# Patient Record
Sex: Female | Born: 1975 | Race: Black or African American | Hispanic: No | Marital: Single | Smoking: Former smoker
Health system: Southern US, Community
[De-identification: ages and names within clinical notes are randomized; demographics above are authoritative.]

## PROBLEM LIST (undated history)

## (undated) HISTORY — PX: FOOT SURGERY: SHX648

## (undated) HISTORY — PX: TUBAL LIGATION: SHX77

## (undated) HISTORY — PX: CARPAL TUNNEL RELEASE: SHX101

---

## 2004-05-21 ENCOUNTER — Emergency Department (HOSPITAL_COMMUNITY): Admission: EM | Admit: 2004-05-21 | Discharge: 2004-05-22 | Payer: Self-pay | Admitting: Emergency Medicine

## 2005-07-28 ENCOUNTER — Emergency Department (HOSPITAL_COMMUNITY): Admission: EM | Admit: 2005-07-28 | Discharge: 2005-07-28 | Payer: Self-pay | Admitting: Emergency Medicine

## 2011-04-28 ENCOUNTER — Ambulatory Visit (INDEPENDENT_AMBULATORY_CARE_PROVIDER_SITE_OTHER): Payer: PRIVATE HEALTH INSURANCE

## 2011-04-28 ENCOUNTER — Inpatient Hospital Stay (INDEPENDENT_AMBULATORY_CARE_PROVIDER_SITE_OTHER)
Admission: RE | Admit: 2011-04-28 | Discharge: 2011-04-28 | Disposition: A | Discharge status: Home / Self Care | Payer: PRIVATE HEALTH INSURANCE | Source: Ambulatory Visit | Attending: Family Medicine | Admitting: Family Medicine

## 2011-04-28 DIAGNOSIS — S93529A Sprain of metatarsophalangeal joint of unspecified toe(s), initial encounter: Secondary | ICD-10-CM

## 2011-06-19 ENCOUNTER — Inpatient Hospital Stay (INDEPENDENT_AMBULATORY_CARE_PROVIDER_SITE_OTHER)
Admission: RE | Admit: 2011-06-19 | Discharge: 2011-06-19 | Disposition: A | Discharge status: Home / Self Care | Payer: Medicaid - Out of State | Source: Ambulatory Visit | Attending: Emergency Medicine | Admitting: Emergency Medicine

## 2011-06-19 DIAGNOSIS — N390 Urinary tract infection, site not specified: Secondary | ICD-10-CM

## 2011-06-19 LAB — POCT URINALYSIS DIP (DEVICE)
Bilirubin Urine: NEGATIVE
Glucose, UA: NEGATIVE mg/dL
Hgb urine dipstick: NEGATIVE
Ketones, ur: 15 mg/dL — AB
Leukocytes, UA: NEGATIVE
Nitrite: NEGATIVE
Protein, ur: NEGATIVE mg/dL
Specific Gravity, Urine: 1.015 (ref 1.005–1.030)
Urobilinogen, UA: 0.2 mg/dL (ref 0.0–1.0)
pH: 5 (ref 5.0–8.0)

## 2011-06-19 LAB — POCT PREGNANCY, URINE: Preg Test, Ur: NEGATIVE

## 2011-06-20 LAB — URINE CULTURE
Colony Count: NO GROWTH
Culture  Setup Time: 201209170031
Culture: NO GROWTH

## 2011-06-21 ENCOUNTER — Emergency Department (HOSPITAL_COMMUNITY): Payer: Medicaid - Out of State

## 2011-06-21 ENCOUNTER — Emergency Department (HOSPITAL_COMMUNITY)
Admission: EM | Admit: 2011-06-21 | Discharge: 2011-06-21 | Disposition: A | Discharge status: Home / Self Care | Payer: Medicaid - Out of State | Attending: Emergency Medicine | Admitting: Emergency Medicine

## 2011-06-21 DIAGNOSIS — R630 Anorexia: Secondary | ICD-10-CM | POA: Insufficient documentation

## 2011-06-21 DIAGNOSIS — R110 Nausea: Secondary | ICD-10-CM | POA: Insufficient documentation

## 2011-06-21 DIAGNOSIS — R109 Unspecified abdominal pain: Secondary | ICD-10-CM | POA: Insufficient documentation

## 2011-06-21 DIAGNOSIS — N390 Urinary tract infection, site not specified: Secondary | ICD-10-CM | POA: Insufficient documentation

## 2011-06-21 LAB — POCT I-STAT, CHEM 8
BUN: 9 mg/dL (ref 6–23)
Calcium, Ion: 1.16 mmol/L (ref 1.12–1.32)
Chloride: 104 mEq/L (ref 96–112)
Creatinine, Ser: 1.4 mg/dL — ABNORMAL HIGH (ref 0.50–1.10)
Glucose, Bld: 82 mg/dL (ref 70–99)
HCT: 38 % (ref 36.0–46.0)
Potassium: 4.1 mEq/L (ref 3.5–5.1)
Sodium: 138 mEq/L (ref 135–145)
TCO2: 23 mmol/L (ref 0–100)

## 2011-06-21 LAB — DIFFERENTIAL
Basophils Absolute: 0 10*3/uL (ref 0.0–0.1)
Basophils Relative: 0 % (ref 0–1)
Eosinophils Absolute: 0.1 10*3/uL (ref 0.0–0.7)
Lymphocytes Relative: 38 % (ref 12–46)
Lymphs Abs: 2 10*3/uL (ref 0.7–4.0)
Monocytes Absolute: 0.5 10*3/uL (ref 0.1–1.0)
Monocytes Relative: 9 % (ref 3–12)
Neutro Abs: 2.6 10*3/uL (ref 1.7–7.7)
Neutrophils Relative %: 51 % (ref 43–77)

## 2011-06-21 LAB — URINALYSIS, ROUTINE W REFLEX MICROSCOPIC
Bilirubin Urine: NEGATIVE
Glucose, UA: NEGATIVE mg/dL
Hgb urine dipstick: NEGATIVE
Ketones, ur: 15 mg/dL — AB
Protein, ur: NEGATIVE mg/dL
Specific Gravity, Urine: 1.016 (ref 1.005–1.030)
Urobilinogen, UA: 1 mg/dL (ref 0.0–1.0)
pH: 6 (ref 5.0–8.0)

## 2011-06-21 LAB — URINE MICROSCOPIC-ADD ON

## 2011-06-21 LAB — CBC
Hemoglobin: 12.2 g/dL (ref 12.0–15.0)
MCH: 28.8 pg (ref 26.0–34.0)
MCHC: 35 g/dL (ref 30.0–36.0)
MCV: 82.3 fL (ref 78.0–100.0)
Platelets: 235 10*3/uL (ref 150–400)
RBC: 4.24 MIL/uL (ref 3.87–5.11)
WBC: 5.1 10*3/uL (ref 4.0–10.5)

## 2011-06-22 LAB — POCT PREGNANCY, URINE: Preg Test, Ur: NEGATIVE

## 2013-05-22 ENCOUNTER — Encounter (HOSPITAL_COMMUNITY): Payer: Self-pay | Admitting: Emergency Medicine

## 2013-05-22 ENCOUNTER — Emergency Department (HOSPITAL_COMMUNITY)
Admission: EM | Admit: 2013-05-22 | Discharge: 2013-05-22 | Disposition: A | Discharge status: Home / Self Care | Payer: Self-pay | Attending: Emergency Medicine | Admitting: Emergency Medicine

## 2013-05-22 DIAGNOSIS — Z4802 Encounter for removal of sutures: Secondary | ICD-10-CM | POA: Insufficient documentation

## 2013-05-22 NOTE — ED Provider Notes (Signed)
Medical screening examination/treatment/procedure(s) were performed by non-physician practitioner and as supervising physician I was immediately available for consultation/collaboration.  Zaven Klemens N Masaki Rothbauer, DO 05/22/13 2231 

## 2013-05-22 NOTE — ED Notes (Signed)
Pt here for suture removal in left pinky finger.  No issues.

## 2013-05-22 NOTE — ED Provider Notes (Signed)
CSN: [id number]     Arrival date & time 05/22/13  1716 History    This chart was scribed for non-physician practitioner Francee Piccolo, PA-C,working with No att. providers found, by Yevette Edwards, ED Scribe. This patient was seen in room WTR2/WLPT2 and the patient's care was started at 7:56 PM   First MD Initiated Contact with Patient 05/22/13 1731     Chief Complaint  Patient presents with  . Suture / Staple Removal    HPI HPI Comments: Tanya Stone is a 37 y.o. female who presents to the Emergency Department for suture removal of six stitches. The pt had six sutures placed to her left fifth digit eight days ago after she had injured it with box-cutters. She denies any fever or chills or drainage from site. Denies numbness or tingling, decreased strength to digit. Her tetanus booster was given at her previous ED visit.   History reviewed. No pertinent past medical history. History reviewed. No pertinent past surgical history. History reviewed. No pertinent family history. History  Substance Use Topics  . Smoking status: Never Smoker   . Smokeless tobacco: Not on file  . Alcohol Use: No   No OB history provided.  Review of Systems  Constitutional: Negative for fever and chills.  Skin: Positive for wound.  All other systems reviewed and are negative.    Allergies  Review of patient's allergies indicates no known allergies.  Home Medications  No current outpatient prescriptions on file.  Triage Vitals: BP 125/69  Pulse 76  Temp(Src) 98.9 F (37.2 C) (Oral)  Resp 18  SpO2 100%  LMP 05/10/2013  Physical Exam  Constitutional: She is oriented to person, place, and time. She appears well-developed and well-nourished. No distress.  HENT:  Head: Normocephalic and atraumatic.  Right Ear: External ear normal.  Left Ear: External ear normal.  Nose: Nose normal.  Mouth/Throat: Oropharynx is clear and moist.  Eyes: Conjunctivae are normal.  Neck: Neck supple.   Musculoskeletal: Normal range of motion. She exhibits no edema and no tenderness.  Neurological: She is alert and oriented to person, place, and time.  Skin: Skin is warm and dry. She is not diaphoretic.  Well healed suture scar left pinky finger. Cap refill < 3 sec. Sensation and motor intact.   Psychiatric: She has a normal mood and affect.    ED Course   DIAGNOSTIC STUDIES:  Oxygen Saturation is 100% on room air, normal by my interpretation.    COORDINATION OF CARE:  5:34 PM- Discussed treatment plan with patient, and the patient agreed to the plan.   Procedures (including critical care time)  SUTURE REMOVAL Performed by: Milus Glazier, RN Consent: Verbal consent obtained. Patient identity confirmed: provided demographic data Time out: Immediately prior to procedure a "time out" was called to verify the correct patient, procedure, equipment, support staff and site/side marked as required.  Location details: left pinky finger  Wound Appearance: clean  Sutures/Staples Removed: 6  Facility: sutures placed in this facility Patient tolerance: Patient tolerated the procedure well with no immediate complications.     Labs Reviewed - No data to display No results found. 1. Visit for suture removal     MDM  Staple removal   Pt to ER for staple/suture removal and wound check as above. Procedure tolerated well. Vitals normal, no signs of infection. Scar minimization & return precautions given at dc. Patient is stable at time of discharge    I personally performed the services described in this documentation,  which was scribed in my presence. The recorded information has been reviewed and is accurate.      Jeannetta Ellis, PA-C 05/22/13 1958

## 2014-05-09 ENCOUNTER — Encounter (HOSPITAL_COMMUNITY): Payer: Self-pay | Admitting: Emergency Medicine

## 2014-05-09 ENCOUNTER — Emergency Department (HOSPITAL_COMMUNITY)
Admission: EM | Admit: 2014-05-09 | Discharge: 2014-05-09 | Disposition: A | Discharge status: Home / Self Care | Payer: Medicaid - Out of State | Attending: Emergency Medicine | Admitting: Emergency Medicine

## 2014-05-09 DIAGNOSIS — M79609 Pain in unspecified limb: Secondary | ICD-10-CM | POA: Insufficient documentation

## 2014-05-09 DIAGNOSIS — M79671 Pain in right foot: Secondary | ICD-10-CM

## 2014-05-09 DIAGNOSIS — Z791 Long term (current) use of non-steroidal anti-inflammatories (NSAID): Secondary | ICD-10-CM | POA: Insufficient documentation

## 2014-05-09 DIAGNOSIS — Z9889 Other specified postprocedural states: Secondary | ICD-10-CM | POA: Insufficient documentation

## 2014-05-09 MED ORDER — IBUPROFEN 800 MG PO TABS: 800.0000 mg | ORAL_TABLET | Freq: Three times a day (TID) | ORAL | Status: DC

## 2014-05-09 MED ORDER — TRAMADOL HCL 50 MG PO TABS: 50.0000 mg | ORAL_TABLET | Freq: Four times a day (QID) | ORAL | Status: DC | PRN

## 2014-05-09 NOTE — ED Notes (Addendum)
Pt presents with c/o of right heel/foot pain that began several weeks ago. Pt reports the pain has worsened and now hurts to walk on it. Pt denies fall, injury, or trauma. Pt is ambulatory to exam room without difficulty, is A/O x4, in NAD, and vitals are WDL.

## 2014-05-09 NOTE — ED Provider Notes (Signed)
CSN: [id number]     Arrival date & time 05/09/14  2206 History  This chart was scribed for non-physician practitioner, Fayrene Helper, PA-C,working with Purvis Sheffield, MD, by Karle Plumber, ED Scribe. This patient was seen in room WTR6/WTR6 and the patient's care was started at 10:28 PM.  Chief Complaint  Patient presents with  . Foot Pain   Patient is a 38 y.o. female presenting with lower extremity pain. The history is provided by the patient. No language interpreter was used.  Foot Pain Pertinent negatives include no chest pain and no shortness of breath.   HPI Comments:  Tanya Stone is a 38 y.o. female who presents to the Emergency Department complaining of worsening, non-radiating, moderate burning right heel pain onset one month ago. She states she is not able to bear full weight on it. She states walking makes the pain worse. She reports taking ASA with minimal relief. She denies ankle pain, numbness, CP, SOB or knee pain.    History reviewed. No pertinent past medical history. Past Surgical History  Procedure Laterality Date  . Foot surgery     No family history on file. History  Substance Use Topics  . Smoking status: Never Smoker   . Smokeless tobacco: Not on file  . Alcohol Use: No   OB History   Grav Para Term Preterm Abortions TAB SAB Ect Mult Living                 Review of Systems  Respiratory: Negative for shortness of breath.   Cardiovascular: Negative for chest pain.  Musculoskeletal: Positive for arthralgias.  Skin: Negative for color change.  Neurological: Negative for numbness.    Allergies  Review of patient's allergies indicates no known allergies.  Home Medications   Prior to Admission medications   Medication Sig Start Date End Date Taking? Authorizing Provider  ibuprofen (ADVIL,MOTRIN) 800 MG tablet Take 1 tablet (800 mg total) by mouth 3 (three) times daily. 05/09/14   Fayrene Helper, PA-C  traMADol (ULTRAM) 50 MG tablet Take 1 tablet (50 mg  total) by mouth every 6 (six) hours as needed. 05/09/14   Fayrene Helper, PA-C   Triage Vitals: BP 127/69  Pulse 79  Temp(Src) 98.4 F (36.9 C) (Oral)  Resp 18  SpO2 100%  LMP 04/05/2014 Physical Exam  Nursing note and vitals reviewed. Constitutional: She is oriented to person, place, and time. She appears well-developed and well-nourished.  HENT:  Head: Normocephalic and atraumatic.  Eyes: EOM are normal.  Neck: Normal range of motion.  Cardiovascular: Normal rate.   Dorsalis pulses palpable.  Pulmonary/Chest: Effort normal.  Musculoskeletal: Normal range of motion. She exhibits tenderness.  Tender to palpation of right heel without any overlying skin changes or signs of infection. Right foot is pes plantus. Full ROM of right ankle. Calf nontender. Full ROM of right knee.  Neurological: She is alert and oriented to person, place, and time.  Sensation intact.  Skin: Skin is warm and dry. No erythema.  Psychiatric: She has a normal mood and affect. Her behavior is normal.    ED Course  Procedures (including critical care time) DIAGNOSTIC STUDIES: Oxygen Saturation is 100% on RA, normal by my interpretation.   COORDINATION OF CARE: 10:35 PM- Suspect heel spur syndrome.  Doubt infectious etiology, doubt DVT.  Pt is NVI.  RICE therapy discussed.  Will prescribe pain medication and refer to orthopedist. Pt verbalizes understanding and agrees to plan.  Medications - No data to display  Labs Review Labs Reviewed - No data to display  Imaging Review No results found.   EKG Interpretation None      MDM   Final diagnoses:  Pain of right heel    BP 127/69  Pulse 79  Temp(Src) 98.4 F (36.9 C) (Oral)  Resp 18  SpO2 100%  LMP 04/05/2014   I personally performed the services described in this documentation, which was scribed in my presence. The recorded information has been reviewed and is accurate.    Fayrene HelperBowie Shakari Qazi, PA-C 05/09/14 2238

## 2014-05-09 NOTE — ED Provider Notes (Signed)
Medical screening examination/treatment/procedure(s) were performed by non-physician practitioner and as supervising physician I was immediately available for consultation/collaboration.   EKG Interpretation None        Donovon Micheletti, MD 05/09/14 2333 

## 2014-05-09 NOTE — Discharge Instructions (Signed)
Your pain is suspected to be due to heel spur.  Follow instruction below.  Follow up with foot specialist as needed.  Soak feet in warm water with epson salt several times daily to aid with pain.  Follow instruction below.    Heel Spur A heel spur is a hook of bone that can form on the calcaneus (the heel bone and the largest bone of the foot). Heel spurs are often associated with plantar fasciitis and usually come in people who have had the problem for an extended period of time. The cause of the relationship is unknown. The pain associated with them is thought to be caused by an inflammation (soreness and redness) of the plantar fascia rather than the spur itself. The plantar fascia is a thick fibrous like tissue that runs from the calcaneus (heel bone) to the ball of the foot. This strong, tight tissue helps maintain the arch of your foot. It helps distribute the weight across your foot as you walk or run. Stresses placed on the plantar fascia can be tremendous. When it is inflamed normal activities become painful. Pain is worse in the morning after sleeping. After sleeping the plantar fascia is tight. The first movements stretch the fascia and this causes pain. As the tendon loosens, the pain usually gets better. It often returns with too much standing or walking.  About 70% of patients with plantar fasciitis have a heel spur. About half of people without foot pain also have heel spurs. DIAGNOSIS  The diagnosis of a heel spur is made by X-ray. The X-ray shows a hook of bone protruding from the bottom of the calcaneus at the point where the plantar fascia is attached to the heel bone.  TREATMENT  It is necessary to find out what is causing the stretching of the plantar fascia. If the cause is over-pronation (flat feet), orthotics and proper foot ware may help.  Stretching exercises, losing weight, wearing shoes that have a cushioned heel that absorbs shock, and elevating the heel with the use of a heel  cradle, heel cup, or orthotics may all help. Heel cradles and heel cups provide extra comfort and cushion to the heel, and reduce the amount of shock to the sore area. AVOIDING THE PAIN OF PLANTAR FASCIITIS AND HEEL SPURS  Consult a sports medicine professional before beginning a new exercise program.  Walking programs offer a good workout. There is a lower chance of overuse injuries common to the runners. There is less impact and less jarring of the joints.  Begin all new exercise programs slowly. If problems or pains develop, decrease the amount of time or distance until you are at a comfortable level.  Wear good shoes and replace them regularly.  Stretch your foot and the heel cords at the back of the ankle (Achilles tendons) both before and after exercise.  Run or exercise on even surfaces that are not hard. For example, asphalt is better than pavement.  Do not run barefoot on hard surfaces.  If using a treadmill, vary the incline.  Do not continue to workout if you have foot or joint problems. Seek professional help if they do not improve. HOME CARE INSTRUCTIONS   Avoid activities that cause you pain until you recover.  Use ice or cold packs to the problem or painful areas after working out.  Only take over-the-counter or prescription medicines for pain, discomfort, or fever as directed by your caregiver.  Soft shoe inserts or athletic shoes with air or  gel sole cushions may be helpful.  If problems continue or become more severe, consult a sports medicine caregiver. Cortisone is a potent anti-inflammatory medication that may be injected into the painful area. You can discuss this treatment with your caregiver. MAKE SURE YOU:   Understand these instructions.  Will watch your condition.  Will get help right away if you are not doing well or get worse. Document Released: 10/26/2005 Document Revised: 12/12/2011 Document Reviewed: 11/20/2013 [facility] Patient Information 2015  [place], [place]. This information is not intended to replace advice given to you by your health care provider. Make sure you discuss any questions you have with your health care provider.

## 2015-03-11 ENCOUNTER — Emergency Department (HOSPITAL_COMMUNITY): Payer: Medicaid - Out of State

## 2015-03-11 ENCOUNTER — Ambulatory Visit: Payer: Self-pay | Admitting: Podiatry

## 2015-03-11 ENCOUNTER — Encounter (HOSPITAL_COMMUNITY): Payer: Self-pay | Admitting: Emergency Medicine

## 2015-03-11 ENCOUNTER — Emergency Department (HOSPITAL_COMMUNITY)
Admission: EM | Admit: 2015-03-11 | Discharge: 2015-03-11 | Disposition: A | Discharge status: Home / Self Care | Payer: Medicaid - Out of State | Attending: Emergency Medicine | Admitting: Emergency Medicine

## 2015-03-11 DIAGNOSIS — M722 Plantar fascial fibromatosis: Secondary | ICD-10-CM

## 2015-03-11 DIAGNOSIS — Z791 Long term (current) use of non-steroidal anti-inflammatories (NSAID): Secondary | ICD-10-CM | POA: Insufficient documentation

## 2015-03-11 DIAGNOSIS — Z9889 Other specified postprocedural states: Secondary | ICD-10-CM | POA: Insufficient documentation

## 2015-03-11 DIAGNOSIS — M79672 Pain in left foot: Secondary | ICD-10-CM | POA: Insufficient documentation

## 2015-03-11 MED ORDER — NAPROXEN 500 MG PO TABS: 500.0000 mg | ORAL_TABLET | Freq: Two times a day (BID) | ORAL | Status: DC

## 2015-03-11 MED ORDER — TRAMADOL HCL 50 MG PO TABS: 50.0000 mg | ORAL_TABLET | Freq: Four times a day (QID) | ORAL | Status: DC | PRN

## 2015-03-11 NOTE — Discharge Instructions (Signed)
Plantar Fasciitis  Plantar fasciitis is a common condition that causes foot pain. It is soreness (inflammation) of the band of tough fibrous tissue on the bottom of the foot that runs from the heel bone (calcaneus) to the ball of the foot. The cause of this soreness may be from excessive standing, poor fitting shoes, running on hard surfaces, being overweight, having an abnormal walk, or overuse (this is common in runners) of the painful foot or feet. It is also common in aerobic exercise dancers and ballet dancers.  SYMPTOMS   Most people with plantar fasciitis complain of:   Severe pain in the morning on the bottom of their foot especially when taking the first steps out of bed. This pain recedes after a few minutes of walking.   Severe pain is experienced also during walking following a long period of inactivity.   Pain is worse when walking barefoot or up stairs  DIAGNOSIS    Your caregiver will diagnose this condition by examining and feeling your foot.   Special tests such as X-rays of your foot, are usually not needed.  PREVENTION    Consult a sports medicine professional before beginning a new exercise program.   Walking programs offer a good workout. With walking there is a lower chance of overuse injuries common to runners. There is less impact and less jarring of the joints.   Begin all new exercise programs slowly. If problems or pain develop, decrease the amount of time or distance until you are at a comfortable level.   Wear good shoes and replace them regularly.   Stretch your foot and the heel cords at the back of the ankle (Achilles tendon) both before and after exercise.   Run or exercise on even surfaces that are not hard. For example, asphalt is better than pavement.   Do not run barefoot on hard surfaces.   If using a treadmill, vary the incline.   Do not continue to workout if you have foot or joint problems. Seek professional help if they do not improve.  HOME CARE INSTRUCTIONS     Avoid activities that cause you pain until you recover.   Use ice or cold packs on the problem or painful areas after working out.   Only take over-the-counter or prescription medicines for pain, discomfort, or fever as directed by your caregiver.   Soft shoe inserts or athletic shoes with air or gel sole cushions may be helpful.   If problems continue or become more severe, consult a sports medicine caregiver or your own health care provider. Cortisone is a potent anti-inflammatory medication that may be injected into the painful area. You can discuss this treatment with your caregiver.  MAKE SURE YOU:    Understand these instructions.   Will watch your condition.   Will get help right away if you are not doing well or get worse.  Document Released: 06/14/2001 Document Revised: 12/12/2011 Document Reviewed: 08/13/2008  [employer] Patient Information 2015 [employer]. This information is not intended to replace advice given to you by your health care provider. Make sure you discuss any questions you have with your health care provider.

## 2015-03-11 NOTE — ED Provider Notes (Signed)
CSN: [id number]     Arrival date & time 03/11/15  0907 History  This chart was scribed for Tanya Creasehristopher J Hydee Fleece, MD by Lyndel SafeKaitlyn Stone, ED Scribe. This patient was seen in room APA18/APA18 and the patient's care was started 9:14 AM.  Chief Complaint  Patient presents with  . Foot Pain   HPI HPI Comments: Tanya GardenerCatrina D Stone is a 39 y.o. female who presents to the Emergency Department complaining of constant, progressively worsening right heel pain onset 1 day ago. She reports associated pain from her right heel radiating up her right leg to her knee. She states the pain has been present in her right foot for approximately a week but has worsened. Pt had bunion surgery on her right foot by Tanya Stone, DPM ~10 years ago. She also notes pain to the ball of her left foot. Pt denies any recent changes in activity.   History reviewed. No pertinent past medical history. Past Surgical History  Procedure Laterality Date  . Foot surgery    . Carpal tunnel release    . Tubal ligation     History reviewed. No pertinent family history. History  Substance Use Topics  . Smoking status: Never Smoker   . Smokeless tobacco: Not on file  . Alcohol Use: No   OB History    No data available     Review of Systems  Constitutional: Negative for fever and activity change.  Musculoskeletal: Positive for myalgias and arthralgias.  Skin: Negative for color change.  All other systems reviewed and are negative.  Allergies  Review of patient's allergies indicates no known allergies.  Home Medications   Prior to Admission medications   Medication Sig Start Date End Date Taking? Authorizing Provider  ibuprofen (ADVIL,MOTRIN) 800 MG tablet Take 1 tablet (800 mg total) by mouth 3 (three) times daily. 05/09/14   Fayrene HelperBowie Tran, PA-C  traMADol (ULTRAM) 50 MG tablet Take 1 tablet (50 mg total) by mouth every 6 (six) hours as needed. 05/09/14   Fayrene HelperBowie Tran, PA-C   BP 138/85 mmHg  Pulse 91  Temp(Src) 98.1 F (36.7 C)  (Oral)  Resp 18  Ht 5\' 9"  (1.753 m)  Wt 230 lb (104.327 kg)  BMI 33.95 kg/m2  SpO2 99%  LMP 03/08/2015   Physical Exam  Constitutional: She is oriented to person, place, and time. She appears well-developed and well-nourished. No distress.  HENT:  Head: Normocephalic and atraumatic.  Right Ear: Hearing normal.  Left Ear: Hearing normal.  Nose: Nose normal.  Mouth/Throat: Oropharynx is clear and moist and mucous membranes are normal.  Eyes: Conjunctivae and EOM are normal. Pupils are equal, round, and reactive to light.  Neck: Normal range of motion. Neck supple.  Cardiovascular: Regular rhythm, S1 normal and S2 normal.  Exam reveals no gallop and no friction rub.   No murmur heard. Pulmonary/Chest: Effort normal and breath sounds normal. No respiratory distress. She exhibits no tenderness.  Abdominal: Soft. Normal appearance and bowel sounds are normal. There is no hepatosplenomegaly. There is no tenderness. There is no rebound, no guarding, no tenderness at McBurney's point and negative Murphy's sign. No hernia.  Musculoskeletal: Normal range of motion.  Right foot tenderness plantar aspect, near heel. Left foot tenderness, plantar aspect, near first MTP joint.  Neurological: She is alert and oriented to person, place, and time. She has normal strength. No cranial nerve deficit or sensory deficit. Coordination normal. GCS eye subscore is 4. GCS verbal subscore is 5. GCS motor subscore is 6.  Skin: Skin is warm, dry and intact. No rash noted. No cyanosis.  Psychiatric: She has a normal mood and affect. Her speech is normal and behavior is normal. Thought content normal.  Nursing note and vitals reviewed.   ED Course  Procedures  DIAGNOSTIC STUDIES: Oxygen Saturation is 99% on RA, normal by my interpretation.    COORDINATION OF CARE: 9:20 AM Discussed treatment plan which includes to get diagnostic imaging. Pt acknowledges and agrees to plan.   Labs Review Labs Reviewed - No  data to display  Imaging Review No results found.   EKG Interpretation None      MDM   Final diagnoses:  None   plantar fasciitis  Presents to the ER for evaluation of bilateral foot pain. Patient complaining of pain at the right heel and the left ball of the foot. Pain worsens with movement. Examination does not reveal any overlying skin changes or signs of infection. There is no inflammation. There is no joint swelling. Tenderness is entirely on the plantar aspect of both feet. X-ray of the right foot was performed because she reports this is the most severe pain. No acute abnormality is noted. Patient experiencing symptoms of plantar fasciitis. She reports follow-up with her foot doctor later this month. Will treat with rest, analgesia.  I personally performed the services described in this documentation, which was scribed in my presence. The recorded information has been reviewed and is accurate.    Tanya Crease, MD 03/11/15 1025

## 2015-03-11 NOTE — ED Notes (Signed)
Pt reports bilateral foot pain for last several weeks. Pt denies any known injury. Pt reports intermittent numbness/tingling as well. Pt reports has appointment with podiatrist 6/23.

## 2016-06-19 ENCOUNTER — Emergency Department (HOSPITAL_COMMUNITY)
Admission: EM | Admit: 2016-06-19 | Discharge: 2016-06-19 | Disposition: A | Discharge status: Home / Self Care | Payer: Medicaid - Out of State | Attending: Emergency Medicine | Admitting: Emergency Medicine

## 2016-06-19 ENCOUNTER — Encounter (HOSPITAL_COMMUNITY): Payer: Self-pay | Admitting: *Deleted

## 2016-06-19 DIAGNOSIS — M25512 Pain in left shoulder: Secondary | ICD-10-CM | POA: Insufficient documentation

## 2016-06-19 MED ORDER — ONDANSETRON 4 MG PO TBDP
4.0000 mg | ORAL_TABLET | Freq: Once | ORAL | Status: AC
  Administered 2016-06-19: 4 mg via ORAL
  Filled 2016-06-19: qty 1

## 2016-06-19 MED ORDER — CYCLOBENZAPRINE HCL 5 MG PO TABS: 5.0000 mg | ORAL_TABLET | Freq: Two times a day (BID) | ORAL | 0 refills | Status: DC | PRN

## 2016-06-19 MED ORDER — BUPIVACAINE-EPINEPHRINE 0.5% -1:200000 IJ SOLN
30.0000 mL | Freq: Once | INTRAMUSCULAR | Status: AC
  Administered 2016-06-19: 150 mg
  Filled 2016-06-19: qty 30

## 2016-06-19 MED ORDER — BUPIVACAINE-EPINEPHRINE (PF) 0.5% -1:200000 IJ SOLN
INTRAMUSCULAR | Status: DC
Start: 2016-06-19 — End: 2016-06-20
  Filled 2016-06-19: qty 30

## 2016-06-19 MED ORDER — LIDOCAINE 5 % EX PTCH: 1.0000 | MEDICATED_PATCH | CUTANEOUS | 0 refills | Status: DC

## 2016-06-19 MED ORDER — NAPROXEN 500 MG PO TABS
500.0000 mg | ORAL_TABLET | Freq: Two times a day (BID) | ORAL | 0 refills | Status: DC
Start: 2016-06-19 — End: 2017-03-07

## 2016-06-19 MED ORDER — HYDROCODONE-ACETAMINOPHEN 5-325 MG PO TABS
1.0000 | ORAL_TABLET | Freq: Once | ORAL | Status: AC
  Administered 2016-06-19: 1 via ORAL
  Filled 2016-06-19: qty 1

## 2016-06-19 NOTE — ED Triage Notes (Signed)
Pt c/o left sided neck pain x 1 week. Pt states when she turns her neck to the right side.

## 2016-06-19 NOTE — ED Provider Notes (Signed)
[facility]PT Provider Note   CSN: [id number] Arrival date & time: 06/19/16  2133     History   Chief Complaint Chief Complaint  Patient presents with  . Neck Pain    HPI   Tanya Stone is a 40 y.o. female who complains of neck pain and shoulder pain for 1 week. The pain is positional with movement of neck without radiation of pain down the arms. Mechanism of injury: unknown, thinks she may have slept wrong.  Symptoms have been worsening since that time. Prior history of neck problems: recurrent self limited episodes of neck pain in the past. There is no numbness, tingling, weakness in the arms.  Marland Kitchen.     HPI  History reviewed. No pertinent past medical history.  There are no active problems to display for this patient.   Past Surgical History:  Procedure Laterality Date  . CARPAL TUNNEL RELEASE    . FOOT SURGERY    . TUBAL LIGATION      OB History    No data available       Home Medications    Prior to Admission medications   Medication Sig Start Date End Date Taking? Authorizing Provider  naproxen (NAPROSYN) 500 MG tablet Take 1 tablet (500 mg total) by mouth 2 (two) times daily. 03/11/15   Gilda Creasehristopher J Pollina, MD  naproxen sodium (ANAPROX) 220 MG tablet Take 440 mg by mouth 2 (two) times daily as needed (pain).    Historical Provider, MD  traMADol (ULTRAM) 50 MG tablet Take 1 tablet (50 mg total) by mouth every 6 (six) hours as needed. 03/11/15   Gilda Creasehristopher J Pollina, MD    Family History History reviewed. No pertinent family history.  Social History Social History  Substance Use Topics  . Smoking status: Never Smoker  . Smokeless tobacco: Never Used  . Alcohol use No     Allergies   Review of patient's allergies indicates no known allergies.   Review of Systems Review of Systems Ten systems reviewed and are negative for acute change, except as noted in the HPI. - Physical Exam Updated Vital Signs BP 119/66 (BP Location: Left Arm)    Pulse 84   Temp 97.5 F (36.4 C) (Oral)   Resp 18   Ht 5\' 9"  (1.753 m)   Wt 108.9 kg   LMP 06/18/2016   SpO2 100%   BMI 35.44 kg/m   Physical Exam  Physical Exam  Nursing note and vitals reviewed. Constitutional: She is oriented to person, place, and time. She appears well-developed and well-nourished. No distress.  HENT:  Head: Normocephalic and atraumatic.  Eyes: Conjunctivae normal and EOM are normal. Pupils are equal, round, and reactive to light. No scleral icterus.  Neck: Normal range of motion.  Cardiovascular: Normal rate, regular rhythm and normal heart sounds.  Exam reveals no gallop and no friction rub.   No murmur heard. Pulmonary/Chest: Effort normal and breath sounds normal. No respiratory distress.  Abdominal: Soft. Bowel sounds are normal. She exhibits no distension and no mass. There is no tenderness. There is no guarding.  Musculoskeletal:  Neurological: She is alert and oriented to person, place, and time.  Skin: Skin is warm and dry. She is not diaphoretic.    ED Treatments / Results  Labs (all labs ordered are listed, but only abnormal results are displayed) Labs Reviewed - No data to display  EKG  EKG Interpretation None       Radiology No results found.  Procedures  Procedures (including critical care time)  Medications Ordered in ED Medications  bupivacaine-epinephrine (MARCAINE W/ EPI) 0.5% -1:200000 injection (not administered)  bupivacaine-EPINEPHrine (MARCAINE W/ EPI) 0.5% -1:200000 (with pres) injection 150 mg (not administered)     Initial Impression / Assessment and Plan / ED Course  I have reviewed the triage vital signs and the nursing notes.  Pertinent labs & imaging results that were available during my care of the patient were reviewed by me and considered in my medical decision making (see chart for details).  Clinical Course    .  Final Clinical Impressions(s) / ED Diagnoses   Final diagnoses:  Trigger point of  left shoulder region   Patient with back pain.  No neurological deficits and normal neuro exam.  Patient can walk but states is painful.  No loss of bowel or bladder control.  No concern for cauda equina.  No fever, night sweats, weight loss, h/o cancer, IVDU.  RICE protocol and pain medicine indicated and discussed with patient.   New Prescriptions New Prescriptions   No medications on file     Arthor Captain, PA-C 06/21/16 0107    Geoffery Lyons, MD 06/21/16 [phone]

## 2017-03-07 ENCOUNTER — Encounter (HOSPITAL_COMMUNITY): Payer: Self-pay | Admitting: *Deleted

## 2017-03-07 ENCOUNTER — Emergency Department (HOSPITAL_COMMUNITY)
Admission: EM | Admit: 2017-03-07 | Discharge: 2017-03-07 | Disposition: A | Discharge status: Home / Self Care | Payer: Self-pay | Attending: Emergency Medicine | Admitting: Emergency Medicine

## 2017-03-07 ENCOUNTER — Emergency Department (HOSPITAL_COMMUNITY): Payer: Self-pay

## 2017-03-07 DIAGNOSIS — R0789 Other chest pain: Secondary | ICD-10-CM | POA: Insufficient documentation

## 2017-03-07 DIAGNOSIS — Z79899 Other long term (current) drug therapy: Secondary | ICD-10-CM | POA: Insufficient documentation

## 2017-03-07 LAB — CBC
HEMATOCRIT: 33.9 % — AB (ref 36.0–46.0)
Hemoglobin: 11.7 g/dL — ABNORMAL LOW (ref 12.0–15.0)
MCH: 29 pg (ref 26.0–34.0)
MCHC: 34.5 g/dL (ref 30.0–36.0)
MCV: 83.9 fL (ref 78.0–100.0)
Platelets: 261 10*3/uL (ref 150–400)
RBC: 4.04 MIL/uL (ref 3.87–5.11)
RDW: 15 % (ref 11.5–15.5)
WBC: 5.3 10*3/uL (ref 4.0–10.5)

## 2017-03-07 LAB — D-DIMER, QUANTITATIVE: D-Dimer, Quant: 0.76 ug/mL-FEU — ABNORMAL HIGH (ref 0.00–0.50)

## 2017-03-07 LAB — BASIC METABOLIC PANEL
Anion gap: 8 (ref 5–15)
BUN: 10 mg/dL (ref 6–20)
CO2: 25 mmol/L (ref 22–32)
Calcium: 9.2 mg/dL (ref 8.9–10.3)
Chloride: 107 mmol/L (ref 101–111)
Creatinine, Ser: 1.08 mg/dL — ABNORMAL HIGH (ref 0.44–1.00)
GFR calc Af Amer: >60 mL/min (ref >60)
GLUCOSE: 102 mg/dL — AB (ref 65–99)
Potassium: 3.7 mmol/L (ref 3.5–5.1)
Sodium: 140 mmol/L (ref 135–145)

## 2017-03-07 LAB — POCT I-STAT TROPONIN I
Troponin i, poc: 0 ng/mL (ref 0.00–0.08)
Troponin i, poc: 0 ng/mL (ref 0.00–0.08)

## 2017-03-07 LAB — TROPONIN I: Troponin I: <0.03 ng/mL (ref <0.03)

## 2017-03-07 MED ORDER — NAPROXEN 250 MG PO TABS
500.0000 mg | ORAL_TABLET | Freq: Once | ORAL | Status: AC
  Administered 2017-03-07: 500 mg via ORAL
  Filled 2017-03-07: qty 2

## 2017-03-07 MED ORDER — IOPAMIDOL (ISOVUE-370) INJECTION 76%
100.0000 mL | Freq: Once | INTRAVENOUS | Status: AC | PRN
  Administered 2017-03-07: 100 mL via INTRAVENOUS

## 2017-03-07 MED ORDER — NAPROXEN 500 MG PO TABS: 500.0000 mg | ORAL_TABLET | Freq: Two times a day (BID) | ORAL | 0 refills | Status: DC

## 2017-03-07 MED ORDER — CYCLOBENZAPRINE HCL 10 MG PO TABS
5.0000 mg | ORAL_TABLET | Freq: Once | ORAL | Status: AC
  Administered 2017-03-07: 5 mg via ORAL
  Filled 2017-03-07: qty 1

## 2017-03-07 MED ORDER — CYCLOBENZAPRINE HCL 5 MG PO TABS: 5.0000 mg | ORAL_TABLET | Freq: Three times a day (TID) | ORAL | 0 refills | Status: DC | PRN

## 2017-03-07 NOTE — ED Triage Notes (Signed)
Pt c/o chest pain that started tonight while sitting down; pt describes the pain as a pressure; pt states she has had left arm pain x 3 days

## 2017-03-07 NOTE — ED Provider Notes (Signed)
[facility] Provider Note   CSN: [id number] Arrival date & time: 03/07/17  0118  Time seen 02:45 AM   History   Chief Complaint Chief Complaint  Patient presents with  . Chest Pain    HPI Tanya Stone is a 41 y.o. female.  HPI  patient reports about 10 PM she was sitting at her house watching TV and she had acute onset of pain in her upper central chest that she describes as pressure. She states it made her feel short of breath and she had nausea without vomiting, no diaphoresis, no coughing. She states she's never had this before. Nothing she does makes it hurt more however taking a big deep breath makes it feel better. She denies feeling anxious or having any current problems. She denies any family history of heart disease. She does describe some pain that starts in the left side of her neck and goes down into her left arm and shoulder that she's had constantly for the past 4 days. She states it does hurt more when she moves her left arm. She has not noticed any pain when she has movement of her head. She denies any injury. She denies any personal history of hypertension, diabetes, high cholesterol. She denies taking birth control pills.  PCP Exie Parody, MD   History reviewed. No pertinent past medical history.  There are no active problems to display for this patient.   Past Surgical History:  Procedure Laterality Date  . CARPAL TUNNEL RELEASE    . FOOT SURGERY    . TUBAL LIGATION      OB History    No data available       Home Medications    Prior to Admission medications   Medication Sig Start Date End Date Taking? Authorizing Provider  cyclobenzaprine (FLEXERIL) 5 MG tablet Take 1 tablet (5 mg total) by mouth 3 (three) times daily as needed. 03/07/17   Devoria Albe, MD  naproxen (NAPROSYN) 500 MG tablet Take 1 tablet (500 mg total) by mouth 2 (two) times daily. 03/07/17   Devoria Albe, MD    Family History History reviewed. No pertinent family  history.  Social History Social History  Substance Use Topics  . Smoking status: Never Smoker  . Smokeless tobacco: Never Used  . Alcohol use No  employed   Allergies   Patient has no known allergies.   Review of Systems Review of Systems  All other systems reviewed and are negative.    Physical Exam Updated Vital Signs BP 117/66   Pulse 75   Temp 98.4 F (36.9 C) (Oral)   Resp 18   Ht 6' (1.829 m)   Wt 115.7 kg (255 lb)   LMP 02/09/2017   SpO2 100%   BMI 34.58 kg/m   Vital signs normal    Physical Exam  Constitutional: She is oriented to person, place, and time. She appears well-developed and well-nourished.  Non-toxic appearance. She does not appear ill. No distress.  HENT:  Head: Normocephalic and atraumatic.  Right Ear: External ear normal.  Left Ear: External ear normal.  Nose: Nose normal. No mucosal edema or rhinorrhea.  Mouth/Throat: Oropharynx is clear and moist and mucous membranes are normal. No dental abscesses or uvula swelling.  Eyes: Conjunctivae and EOM are normal. Pupils are equal, round, and reactive to light.  Neck: Normal range of motion and full passive range of motion without pain. Neck supple.    Patient has diffuse tenderness of the left  paraspinous muscles of the cervical spine and into the left trapezius that reproduces her complaints of pain in her left arm and shoulder.  Cardiovascular: Normal rate, regular rhythm and normal heart sounds.  Exam reveals no gallop and no friction rub.   No murmur heard. Pulmonary/Chest: Effort normal and breath sounds normal. No respiratory distress. She has no wheezes. She has no rhonchi. She has no rales. She exhibits no tenderness and no crepitus.    Area of chest pain noted  Abdominal: Soft. Normal appearance and bowel sounds are normal. She exhibits no distension. There is no tenderness. There is no rebound and no guarding.  Musculoskeletal: Normal range of motion. She exhibits no edema or  tenderness.  Moves all extremities well.   Neurological: She is alert and oriented to person, place, and time. She has normal strength. No cranial nerve deficit.  Skin: Skin is warm, dry and intact. No rash noted. No erythema. No pallor.  Psychiatric: She has a normal mood and affect. Her speech is normal and behavior is normal. Her mood appears not anxious.  Nursing note and vitals reviewed.    ED Treatments / Results  Labs (all labs ordered are listed, but only abnormal results are displayed) Results for orders placed or performed during the hospital encounter of 03/07/17  Basic metabolic panel  Result Value Ref Range   Sodium 140 135 - 145 mmol/L   Potassium 3.7 3.5 - 5.1 mmol/L   Chloride 107 101 - 111 mmol/L   CO2 25 22 - 32 mmol/L   Glucose, Bld 102 (H) 65 - 99 mg/dL   BUN 10 6 - 20 mg/dL   Creatinine, Ser 1.611.08 (H) 0.44 - 1.00 mg/dL   Calcium 9.2 8.9 - 09.610.3 mg/dL   GFR calc non Af Amer >60 >60 mL/min   GFR calc Af Amer >60 >60 mL/min   Anion gap 8 5 - 15  CBC  Result Value Ref Range   WBC 5.3 4.0 - 10.5 K/uL   RBC 4.04 3.87 - 5.11 MIL/uL   Hemoglobin 11.7 (L) 12.0 - 15.0 g/dL   HCT 04.533.9 (L) 40.936.0 - 81.146.0 %   MCV 83.9 78.0 - 100.0 fL   MCH 29.0 26.0 - 34.0 pg   MCHC 34.5 30.0 - 36.0 g/dL   RDW 91.415.0 78.211.5 - 95.615.5 %   Platelets 261 150 - 400 K/uL  Troponin I  Result Value Ref Range   Troponin I <0.03 <0.03 ng/mL  D-dimer, quantitative  Result Value Ref Range   D-Dimer, Quant 0.76 (H) 0.00 - 0.50 ug/mL-FEU  POCT i-Stat troponin I  Result Value Ref Range   Troponin i, poc 0.00 0.00 - 0.08 ng/mL   Comment 3          POCT i-Stat troponin I  Result Value Ref Range   Troponin i, poc 0.00 0.00 - 0.08 ng/mL   Comment 3           Laboratory interpretation all normal except Mildly elevated d-dimer    EKG  EKG Interpretation  Date/Time:  Tuesday March 07 2017 01:27:21 EDT Ventricular Rate:  81 PR Interval:  148 QRS Duration: 82 QT Interval:  386 QTC  Calculation: 448 R Axis:   50 Text Interpretation:  Normal sinus rhythm Nonspecific T wave abnormality Electrode noise Baseline wander No old tracing to compare Confirmed by Devoria AlbeKnapp, Kwadwo Taras ([id number]) on 03/07/2017 1:49:19 AM       Radiology Dg Chest 2 View  Result Date: 03/07/2017 CLINICAL DATA:  Chest pain.  Shortness of breath. EXAM: CHEST  2 VIEW COMPARISON:  None. FINDINGS: The cardiomediastinal contours are normal. The lungs are clear. Pulmonary vasculature is normal. No consolidation, pleural effusion, or pneumothorax. No acute osseous abnormalities are seen. IMPRESSION: No acute abnormality. Electronically Signed   By: Rubye Oaks M.D.   On: 03/07/2017 02:35   Ct Angio Chest Pe W/cm &/or Wo Cm  Result Date: 03/07/2017 CLINICAL DATA:  Chest pain. EXAM: CT ANGIOGRAPHY CHEST WITH CONTRAST TECHNIQUE: Multidetector CT imaging of the chest was performed using the standard protocol during bolus administration of intravenous contrast. Multiplanar CT image reconstructions and MIPs were obtained to evaluate the vascular anatomy. CONTRAST:  100 cc Isovue 370 IV COMPARISON:  Radiographs earlier this day FINDINGS: Cardiovascular: There are no filling defects within the pulmonary arteries to suggest pulmonary embolus. No aortic dissection or aneurysm. Left vertebral artery arises directly from the aorta, a normal variant. The heart is normal in size. Mediastinum/Nodes: No mediastinal or hilar adenopathy. There prominent bilateral axillary nodes and enlarged left subpectoral node. Largest left subpectoral node measures 11 mm short axis. Largest right axillary node measures 14 mm short axis. No evidence of dominant breast mass. The esophagus decompressed. Visualized thyroid gland is normal. Lungs/Pleura: No consolidation, pulmonary edema or pleural fluid. No pulmonary mass or suspicious nodule. Trace dependent atelectasis. Upper Abdomen: No acute abnormality. Musculoskeletal: There are no acute or suspicious osseous  abnormalities. Review of the MIP images confirms the above findings. IMPRESSION: 1. No pulmonary embolus or acute intrathoracic abnormality. 2. Mild bilateral axillary and left subpectoral adenopathy. This is nonspecific, however recommend mammographic evaluation to exclude breast malignancy. Electronically Signed   By: Rubye Oaks M.D.   On: 03/07/2017 06:33    Procedures Procedures (including critical care time)  Medications Ordered in ED Medications  naproxen (NAPROSYN) tablet 500 mg (500 mg Oral Given 03/07/17 0307)  cyclobenzaprine (FLEXERIL) tablet 5 mg (5 mg Oral Given 03/07/17 0308)  iopamidol (ISOVUE-370) 76 % injection 100 mL (100 mLs Intravenous Contrast Given 03/07/17 0549)     Initial Impression / Assessment and Plan / ED Course  I have reviewed the triage vital signs and the nursing notes.  Pertinent labs & imaging results that were available during my care of the patient were reviewed by me and considered in my medical decision making (see chart for details).  Patient was given naproxen and Flexeril for her neck pain. We discussed her initial laboratory results including a normal i-STAT troponin. We discussed that she needs a delta troponin done and that was ordered for 4:30 AM and a d-dimer was added to her blood work.   5 AM we discussed her blood work results. She states she does have an aunt who has history of frequent DVT. Therefore CT angiogram was done to look for PE.  Lab canceled my troponin and did the lab work on their existing blood. So patient ever had the delta troponin done as I had ordered. 6:30 AM nursing staff are going to do a i-STAT troponin.  6:45 AM we discussed her CT results. I am unable to order a mammogram for her however she states she has a primary care doctor, Dr. Allene Dillon, and she is advised to call his office to get one arranged. She was discharged home with anti-inflammatory muscle relaxer for her chest wall pain. Her delta troponins were  normal.   Final Clinical Impressions(s) / ED Diagnoses   Final diagnoses:  Chest wall pain    New  Prescriptions New Prescriptions   CYCLOBENZAPRINE (FLEXERIL) 5 MG TABLET    Take 1 tablet (5 mg total) by mouth 3 (three) times daily as needed.   NAPROXEN (NAPROSYN) 500 MG TABLET    Take 1 tablet (500 mg total) by mouth 2 (two) times daily.    Plan discharge  Devoria Albe, MD, Concha Pyo, MD 03/07/17 [phone]

## 2017-03-07 NOTE — Discharge Instructions (Signed)
Use ice and heat on your chest for comfort. Take the medications as prescribed for the pain. Please let Dr Colon Flatteryorres's office know you need to get a mammogram done because you did have some enlarged lymph nodes in you axilla (arm pits) which can be from the breast.   Recheck if you get a fever, struggle to breathe or seem worse.

## 2017-03-24 ENCOUNTER — Other Ambulatory Visit (HOSPITAL_COMMUNITY): Payer: Self-pay | Admitting: *Deleted

## 2017-03-24 DIAGNOSIS — N6320 Unspecified lump in the left breast, unspecified quadrant: Secondary | ICD-10-CM

## 2017-04-04 ENCOUNTER — Encounter (HOSPITAL_COMMUNITY): Payer: Self-pay

## 2017-04-04 ENCOUNTER — Emergency Department (HOSPITAL_COMMUNITY)
Admission: EM | Admit: 2017-04-04 | Discharge: 2017-04-05 | Disposition: A | Discharge status: Home / Self Care | Payer: Medicaid - Out of State | Attending: Emergency Medicine | Admitting: Emergency Medicine

## 2017-04-04 DIAGNOSIS — M542 Cervicalgia: Secondary | ICD-10-CM | POA: Insufficient documentation

## 2017-04-04 DIAGNOSIS — M25512 Pain in left shoulder: Secondary | ICD-10-CM

## 2017-04-04 MED ORDER — LIDOCAINE-EPINEPHRINE (PF) 2 %-1:200000 IJ SOLN
10.0000 mL | Freq: Once | INTRAMUSCULAR | Status: AC
  Administered 2017-04-04: 10 mL
  Filled 2017-04-04: qty 20

## 2017-04-04 MED ORDER — BACLOFEN 10 MG PO TABS: 10.0000 mg | ORAL_TABLET | Freq: Three times a day (TID) | ORAL | 0 refills | Status: DC

## 2017-04-04 MED ORDER — MELOXICAM 15 MG PO TABS: 15.0000 mg | ORAL_TABLET | Freq: Every day | ORAL | 0 refills | Status: DC

## 2017-04-04 NOTE — ED Provider Notes (Signed)
[facility]-EMERGENCY DEPT Provider Note   CSN: [id number] Arrival date & time: 04/04/17  2219     History   Chief Complaint Chief Complaint  Patient presents with  . Shoulder Pain    HPI Tanya Stone is a 41 y.o. female who presents emergency Department with chief complaint of left shoulder and neck pain. Patient states that it has been bothering her for some time, however, the pain has increased and she is unable to get any comfort. She's taken Tylenol without relief. She's had episodes of this before and had a trigger point injection previously that helped significantly. About 1 year ago. She denies any new weakness or numbness in the upper extremities. She denies fever, headaches or other signs of infection. HPI  History reviewed. No pertinent past medical history.  There are no active problems to display for this patient.   Past Surgical History:  Procedure Laterality Date  . CARPAL TUNNEL RELEASE    . FOOT SURGERY    . TUBAL LIGATION      OB History    No data available       Home Medications    Prior to Admission medications   Medication Sig Start Date End Date Taking? Authorizing Provider  acetaminophen (TYLENOL) 325 MG tablet Take 650 mg by mouth every 6 (six) hours as needed for moderate pain.   Yes [provider]  cyclobenzaprine (FLEXERIL) 5 MG tablet Take 1 tablet (5 mg total) by mouth 3 (three) times daily as needed. Patient not taking: Reported on 04/04/2017 03/07/17   Devoria Albe, MD  naproxen (NAPROSYN) 500 MG tablet Take 1 tablet (500 mg total) by mouth 2 (two) times daily. Patient not taking: Reported on 04/04/2017 03/07/17   Devoria Albe, MD    Family History History reviewed. No pertinent family history.  Social History Social History  Substance Use Topics  . Smoking status: Never Smoker  . Smokeless tobacco: Never Used  . Alcohol use No     Allergies   Patient has no known allergies.   Review of Systems Review of Systems Ten systems  reviewed and are negative for acute change, except as noted in the HPI.    Physical Exam Updated Vital Signs BP 131/81 (BP Location: Right Arm)   Pulse 90   Temp 98.5 F (36.9 C) (Oral)   Resp 16   Ht 6' (1.829 m)   Wt 115.2 kg (254 lb)   LMP 03/17/2017   SpO2 98%   BMI 34.45 kg/m   Physical Exam  Physical Exam  Nursing note and vitals reviewed. Constitutional: She is oriented to person, place, and time. She appears well-developed and well-nourished. No distress.  HENT:  Head: Normocephalic and atraumatic.  Eyes: Conjunctivae normal and EOM are normal. Pupils are equal, round, and reactive to light. No scleral icterus.  Neck: Normal range of motion.  Cardiovascular: Normal rate, regular rhythm and normal heart sounds.  Exam reveals no gallop and no friction rub.   No murmur heard. Pulmonary/Chest: Effort normal and breath sounds normal. No respiratory distress.  Abdominal: Soft. Bowel sounds are normal. She exhibits no distension and no mass. There is no tenderness. There is no guarding.  Neurological: She is alert and oriented to person, place, and time.  Musculoskeletal: patient with large pendulous breasts and forward rounding shoulders. She has an active trigger point at the levator scapula on the left. Normal upper extremity strength and sensation. Skin: Skin is warm and dry. She is not diaphoretic.  ED Treatments / Results  Labs (all labs ordered are listed, but only abnormal results are displayed) Labs Reviewed - No data to display  EKG  EKG Interpretation None      Trigger Point Injection Date/Time:04/04/2017 11:05 PM Performed by: Arthor CaptainHARRIS, Kathrine Rieves Authorized by: Arthor CaptainHARRIS, Annie Saephan Consent: Verbal consent obtained. Risks and benefits: risks, benefits and alternatives were discussed Consent given by: patient Patient identity confirmed: provided patient demograhics Preparation: Patient was prepped and draped in the usual sterile fashion. Local anesthesia used:  yes Anesthesia: local infiltration Local anesthetic: lidocaine 2%w epi  Anesthetic total: 10 Patient tolerance: Patient tolerated the procedure well with no immediate complications    Radiology No results found.  Procedures Procedures (including critical care time)  Medications Ordered in ED Medications  lidocaine-EPINEPHrine (XYLOCAINE W/EPI) 2 %-1:200000 (PF) injection 10 mL (not administered)     Initial Impression / Assessment and Plan / ED Course  I have reviewed the triage vital signs and the nursing notes.  Pertinent labs & imaging results that were available during my care of the patient were reviewed by me and considered in my medical decision making (see chart for details).      patient with trigger point. Treated with injection. Pain and range of motion improved significantly. Appears safe for discharge at this time.  Final Clinical Impressions(s) / ED Diagnoses   Final diagnoses:  None    New Prescriptions New Prescriptions   No medications on file     Arthor CaptainHarris, Kairy Folsom, PA-C 04/05/17 0124    Lavera GuiseLiu, Dana Duo, MD 04/05/17 1155

## 2017-04-04 NOTE — ED Triage Notes (Signed)
Pt complains of left shoulder and neck pain Pt denies any injury

## 2017-04-04 NOTE — Discharge Instructions (Signed)
Return for any new numbness or weakness, also for any new signs of infection

## 2017-04-06 ENCOUNTER — Other Ambulatory Visit: Payer: Medicaid - Out of State

## 2017-04-06 ENCOUNTER — Other Ambulatory Visit (HOSPITAL_COMMUNITY): Payer: Self-pay | Admitting: Obstetrics and Gynecology

## 2017-04-06 ENCOUNTER — Ambulatory Visit (HOSPITAL_COMMUNITY): Payer: Medicaid - Out of State

## 2017-04-28 ENCOUNTER — Other Ambulatory Visit (HOSPITAL_COMMUNITY): Payer: Self-pay | Admitting: *Deleted

## 2017-04-28 DIAGNOSIS — N6320 Unspecified lump in the left breast, unspecified quadrant: Secondary | ICD-10-CM

## 2017-05-16 ENCOUNTER — Ambulatory Visit (HOSPITAL_COMMUNITY): Payer: Medicaid - Out of State

## 2017-05-16 ENCOUNTER — Other Ambulatory Visit: Payer: Medicaid - Out of State

## 2017-06-08 ENCOUNTER — Other Ambulatory Visit (HOSPITAL_COMMUNITY): Payer: Self-pay | Admitting: Obstetrics and Gynecology

## 2017-06-08 ENCOUNTER — Ambulatory Visit
Admission: RE | Admit: 2017-06-08 | Discharge: 2017-06-08 | Disposition: A | Discharge status: Home / Self Care | Payer: No Typology Code available for payment source | Source: Ambulatory Visit | Attending: Obstetrics and Gynecology | Admitting: Obstetrics and Gynecology

## 2017-06-08 ENCOUNTER — Ambulatory Visit (HOSPITAL_COMMUNITY)
Admission: RE | Admit: 2017-06-08 | Discharge: 2017-06-08 | Disposition: A | Discharge status: Home / Self Care | Payer: Self-pay | Source: Ambulatory Visit | Attending: Obstetrics and Gynecology | Admitting: Obstetrics and Gynecology

## 2017-06-08 ENCOUNTER — Encounter (HOSPITAL_COMMUNITY): Payer: Self-pay

## 2017-06-08 VITALS — BP 118/76 | HR 84 | Temp 98.2°F | Ht 67.75 in | Wt 260.4 lb

## 2017-06-08 DIAGNOSIS — N6320 Unspecified lump in the left breast, unspecified quadrant: Secondary | ICD-10-CM

## 2017-06-08 DIAGNOSIS — Z1239 Encounter for other screening for malignant neoplasm of breast: Secondary | ICD-10-CM

## 2017-06-08 DIAGNOSIS — N644 Mastodynia: Secondary | ICD-10-CM

## 2017-06-08 DIAGNOSIS — N6310 Unspecified lump in the right breast, unspecified quadrant: Secondary | ICD-10-CM

## 2017-06-08 NOTE — Patient Instructions (Signed)
Explained breast self awareness with Tanya Stone. Patient did not need a Pap smear today due to last Pap smear was in October 2017 per patient. Let her know [facility] will cover Pap smears every 3 years unless has a history of abnormal Pap smears. [facility] and possible right breast ultrasound. Appointment scheduled for Thursday, June 08, 2017 at 1250. Tanya Stone verbalized understanding.  [name], Kathaleen Maserhristine Poll, RN 1:32 PM

## 2017-06-08 NOTE — Progress Notes (Signed)
Patient had a CT of the chest completed 03/07/2017 that showed mild bilateral lymphadenopathy that diagnostic mammogram is recommended for follow. Patient complained of right breast pain since June 2018 that comes and goes. Patient rates the pain at a 5 out of 10.  Pap Smear: Pap smear not completed today. Last Pap smear was in October 2017 at Dr. Allene Dillonorres office in [place] and normal per patient. Per patient has a history of an abnormal Pap smear 10 years ago that a colposcopy was completed for follow-up. Patient states she has had at least three normal Pap smears since colposcopy. No Pap smear results are in EPIC.  Physical exam: Breasts Breasts symmetrical. No skin abnormalities bilateral breasts. No nipple retraction bilateral breasts. No nipple discharge bilateral breasts. No lymphadenopathy. No lumps palpated bilateral breasts. Complaints of right breast pain at 9 o'clock 11 cm from the nipple on exam. [facility] mamm[facility] and possible right breast ultrasound. Appointment scheduled for Thursday, June 08, 2017 at 1250.        Pelvic/Bimanual No Pap smear completed today since last Pap smear was in October 2017 per patient. Pap smear not indicated per [facility] guidelines.   Smoking History: Patient has never smoked.  Patient Navigation: Patient education provided. Access to services provided for [facility].

## 2017-06-14 ENCOUNTER — Encounter (HOSPITAL_COMMUNITY): Payer: Self-pay

## 2017-09-06 ENCOUNTER — Emergency Department (HOSPITAL_COMMUNITY)
Admission: EM | Admit: 2017-09-06 | Discharge: 2017-09-06 | Disposition: A | Discharge status: Home / Self Care | Payer: No Typology Code available for payment source | Attending: Emergency Medicine | Admitting: Emergency Medicine

## 2017-09-06 ENCOUNTER — Other Ambulatory Visit: Payer: Self-pay

## 2017-09-06 ENCOUNTER — Encounter (HOSPITAL_COMMUNITY): Payer: Self-pay | Admitting: Emergency Medicine

## 2017-09-06 ENCOUNTER — Emergency Department (HOSPITAL_COMMUNITY): Payer: No Typology Code available for payment source

## 2017-09-06 DIAGNOSIS — R51 Headache: Secondary | ICD-10-CM | POA: Insufficient documentation

## 2017-09-06 DIAGNOSIS — Z5321 Procedure and treatment not carried out due to patient leaving prior to being seen by health care provider: Secondary | ICD-10-CM | POA: Insufficient documentation

## 2017-09-06 DIAGNOSIS — R200 Anesthesia of skin: Secondary | ICD-10-CM | POA: Insufficient documentation

## 2017-09-06 LAB — CBC
HEMATOCRIT: 31.6 % — AB (ref 36.0–46.0)
Hemoglobin: 10.7 g/dL — ABNORMAL LOW (ref 12.0–15.0)
MCH: 28.5 pg (ref 26.0–34.0)
MCHC: 33.9 g/dL (ref 30.0–36.0)
MCV: 84.3 fL (ref 78.0–100.0)
Platelets: 252 10*3/uL (ref 150–400)
RBC: 3.75 MIL/uL — ABNORMAL LOW (ref 3.87–5.11)
RDW: 15 % (ref 11.5–15.5)
WBC: 4.7 10*3/uL (ref 4.0–10.5)

## 2017-09-06 LAB — I-STAT CHEM 8, ED
BUN: 11 mg/dL (ref 6–20)
CALCIUM ION: 1.2 mmol/L (ref 1.15–1.40)
Chloride: 103 mmol/L (ref 101–111)
Creatinine, Ser: 1 mg/dL (ref 0.44–1.00)
Glucose, Bld: 84 mg/dL (ref 65–99)
HEMATOCRIT: 32 % — AB (ref 36.0–46.0)
HEMOGLOBIN: 10.9 g/dL — AB (ref 12.0–15.0)
Potassium: 3.9 mmol/L (ref 3.5–5.1)
SODIUM: 141 mmol/L (ref 135–145)
TCO2: 26 mmol/L (ref 22–32)

## 2017-09-06 LAB — DIFFERENTIAL
Basophils Absolute: 0 10*3/uL (ref 0.0–0.1)
Basophils Relative: 1 %
EOS ABS: 0.1 10*3/uL (ref 0.0–0.7)
EOS PCT: 3 %
LYMPHS ABS: 2.4 10*3/uL (ref 0.7–4.0)
LYMPHS PCT: 49 %
MONO ABS: 0.3 10*3/uL (ref 0.1–1.0)
Monocytes Relative: 5 %
NEUTROS PCT: 42 %
Neutro Abs: 2 10*3/uL (ref 1.7–7.7)

## 2017-09-06 LAB — COMPREHENSIVE METABOLIC PANEL
ALBUMIN: 3.7 g/dL (ref 3.5–5.0)
ALK PHOS: 53 U/L (ref 38–126)
ALT: 17 U/L (ref 14–54)
ANION GAP: 8 (ref 5–15)
AST: 22 U/L (ref 15–41)
BILIRUBIN TOTAL: 0.3 mg/dL (ref 0.3–1.2)
BUN: 10 mg/dL (ref 6–20)
CALCIUM: 9.2 mg/dL (ref 8.9–10.3)
CO2: 26 mmol/L (ref 22–32)
Chloride: 103 mmol/L (ref 101–111)
Creatinine, Ser: 1.05 mg/dL — ABNORMAL HIGH (ref 0.44–1.00)
Glucose, Bld: 90 mg/dL (ref 65–99)
POTASSIUM: 3.9 mmol/L (ref 3.5–5.1)
Sodium: 137 mmol/L (ref 135–145)
TOTAL PROTEIN: 6.7 g/dL (ref 6.5–8.1)

## 2017-09-06 LAB — I-STAT BETA HCG BLOOD, ED (MC, WL, AP ONLY)

## 2017-09-06 LAB — I-STAT TROPONIN, ED: TROPONIN I, POC: 0 ng/mL (ref 0.00–0.08)

## 2017-09-06 LAB — PROTIME-INR
INR: 1.1
PROTHROMBIN TIME: 14.1 s (ref 11.4–15.2)

## 2017-09-06 LAB — APTT: aPTT: 37 seconds — ABNORMAL HIGH (ref 24–36)

## 2017-09-06 NOTE — ED Triage Notes (Signed)
Pt c/o 9/10 HA with left side numbness that started around 9 pm, LSW at 1900, Pt AO x 4 no neuro deficit noticed.

## 2017-09-06 NOTE — ED Notes (Signed)
Pt decided to leave 

## 2017-09-14 ENCOUNTER — Emergency Department (HOSPITAL_COMMUNITY)
Admission: EM | Admit: 2017-09-14 | Discharge: 2017-09-14 | Disposition: A | Discharge status: Home / Self Care | Payer: Self-pay | Attending: Emergency Medicine | Admitting: Emergency Medicine

## 2017-09-14 ENCOUNTER — Encounter (HOSPITAL_COMMUNITY): Payer: Self-pay | Admitting: Emergency Medicine

## 2017-09-14 DIAGNOSIS — R109 Unspecified abdominal pain: Secondary | ICD-10-CM

## 2017-09-14 DIAGNOSIS — R338 Other retention of urine: Secondary | ICD-10-CM | POA: Insufficient documentation

## 2017-09-14 DIAGNOSIS — Z79899 Other long term (current) drug therapy: Secondary | ICD-10-CM | POA: Insufficient documentation

## 2017-09-14 DIAGNOSIS — R300 Dysuria: Secondary | ICD-10-CM | POA: Insufficient documentation

## 2017-09-14 DIAGNOSIS — Z7982 Long term (current) use of aspirin: Secondary | ICD-10-CM | POA: Insufficient documentation

## 2017-09-14 LAB — URINALYSIS, ROUTINE W REFLEX MICROSCOPIC
Bilirubin Urine: NEGATIVE
GLUCOSE, UA: NEGATIVE mg/dL
HGB URINE DIPSTICK: NEGATIVE
KETONES UR: NEGATIVE mg/dL
Nitrite: NEGATIVE
PROTEIN: NEGATIVE mg/dL
Specific Gravity, Urine: 1.014 (ref 1.005–1.030)
pH: 5 (ref 5.0–8.0)

## 2017-09-14 LAB — POC URINE PREG, ED: Preg Test, Ur: NEGATIVE

## 2017-09-14 MED ORDER — CEPHALEXIN 500 MG PO CAPS: 500.0000 mg | ORAL_CAPSULE | Freq: Four times a day (QID) | ORAL | 0 refills | Status: DC

## 2017-09-14 MED ORDER — PHENAZOPYRIDINE HCL 200 MG PO TABS: 200.0000 mg | ORAL_TABLET | Freq: Three times a day (TID) | ORAL | 0 refills | Status: DC | PRN

## 2017-09-14 NOTE — ED Triage Notes (Signed)
Patient c/o right flank pain and increase in urge to urinate but when goes to bathroom not a lot comes out.

## 2017-09-14 NOTE — ED Provider Notes (Signed)
[facility]-EMERGENCY DEPT Provider Note   CSN: [id number] Arrival date & time: 09/14/17  0815     History   Chief Complaint Chief Complaint  Patient presents with  . Flank Pain  . Urinary Retention    HPI Tanya Stone is a 41 y.o. female.  [age] yo F with a cc of dysuria and right-sided flank pain.  This been going on for the past couple days.  Patient feels that she has to urinate and then has trouble urinating.  When she does it hurts.  She does not feels that she has to pee again right afterwards.  She has been drinking lots of fluids without improvement.  Denies fevers denies nausea or vomiting.  Has had symptoms previously and felt it was a kidney infection.  Denies history of stones.  She denies vaginal bleeding or discharge.   The history is provided by the patient.  Flank Pain  This is a new problem. The current episode started 2 days ago. The problem occurs constantly. The problem has been rapidly worsening. Pertinent negatives include no chest pain, no headaches and no shortness of breath. Nothing aggravates the symptoms. Nothing relieves the symptoms. She has tried nothing for the symptoms. The treatment provided no relief.    History reviewed. No pertinent past medical history.  There are no active problems to display for this patient.   Past Surgical History:  Procedure Laterality Date  . CARPAL TUNNEL RELEASE    . FOOT SURGERY    . TUBAL LIGATION      OB History    Gravida Para Term Preterm AB Living   3 3       3    SAB TAB Ectopic Multiple Live Births           3       Home Medications    Prior to Admission medications   Medication Sig Start Date End Date Taking? Authorizing Provider  acetaminophen (TYLENOL) 325 MG tablet Take 650 mg by mouth every 6 (six) hours as needed for moderate pain.   Yes [provider]  aspirin-acetaminophen-caffeine (EXCEDRIN MIGRAINE) [id number] MG tablet Take 2 tablets by mouth every 6  (six) hours as needed for headache.   Yes [provider]  baclofen (LIORESAL) 10 MG tablet Take 1 tablet (10 mg total) by mouth 3 (three) times daily. Patient not taking: Reported on 09/14/2017 04/04/17   Arthor CaptainHarris, Abigail, PA-C  cephALEXin (KEFLEX) 500 MG capsule Take 1 capsule (500 mg total) by mouth 4 (four) times daily. 09/14/17   Melene PlanFloyd, Zya Finkle, DO  cyclobenzaprine (FLEXERIL) 5 MG tablet Take 1 tablet (5 mg total) by mouth 3 (three) times daily as needed. Patient not taking: Reported on 04/04/2017 03/07/17   Devoria AlbeKnapp, Iva, MD  meloxicam (MOBIC) 15 MG tablet Take 1 tablet (15 mg total) by mouth daily. Take 1 daily with food. Patient not taking: Reported on 09/14/2017 04/04/17   Arthor CaptainHarris, Abigail, PA-C  naproxen (NAPROSYN) 500 MG tablet Take 1 tablet (500 mg total) by mouth 2 (two) times daily. Patient not taking: Reported on 04/04/2017 03/07/17   Devoria AlbeKnapp, Iva, MD  phenazopyridine (PYRIDIUM) 200 MG tablet Take 1 tablet (200 mg total) by mouth 3 (three) times daily as needed for pain. 09/14/17   Melene PlanFloyd, Naisha Wisdom, DO    Family History No family history on file.  Social History Social History   Tobacco Use  . Smoking status: Never Smoker  . Smokeless tobacco: Never Used  Substance Use Topics  .  Alcohol use: Yes    Comment: ocassionally  . Drug use: No     Allergies   Patient has no known allergies.   Review of Systems Review of Systems  Constitutional: Negative for chills and fever.  HENT: Negative for congestion and rhinorrhea.   Eyes: Negative for redness and visual disturbance.  Respiratory: Negative for shortness of breath and wheezing.   Cardiovascular: Negative for chest pain and palpitations.  Gastrointestinal: Negative for nausea and vomiting.  Genitourinary: Positive for difficulty urinating, dysuria and flank pain. Negative for urgency.  Musculoskeletal: Negative for arthralgias and myalgias.  Skin: Negative for pallor and wound.  Neurological: Negative for dizziness and headaches.       Physical Exam Updated Vital Signs BP 133/83 (BP Location: Right Arm)   Pulse 81   Temp 97.7 F (36.5 C) (Oral)   Resp 16   Ht 5\' 9"  (1.753 m)   Wt 111.1 kg (245 lb)   LMP 08/28/2017   SpO2 100%   BMI 36.18 kg/m   Physical Exam  Constitutional: She is oriented to person, place, and time. She appears well-developed and well-nourished. No distress.  HENT:  Head: Normocephalic and atraumatic.  Eyes: EOM are normal. Pupils are equal, round, and reactive to light.  Neck: Normal range of motion. Neck supple.  Cardiovascular: Normal rate and regular rhythm. Exam reveals no gallop and no friction rub.  No murmur heard. Pulmonary/Chest: Effort normal. She has no wheezes. She has no rales.  Abdominal: Soft. She exhibits no distension and no mass. There is no tenderness. There is no guarding.  Musculoskeletal: She exhibits no edema or tenderness.  Neurological: She is alert and oriented to person, place, and time.  Skin: Skin is warm and dry. She is not diaphoretic.  Psychiatric: She has a normal mood and affect. Her behavior is normal.  Nursing note and vitals reviewed.    ED Treatments / Results  Labs (all labs ordered are listed, but only abnormal results are displayed) Labs Reviewed  URINALYSIS, ROUTINE W REFLEX MICROSCOPIC - Abnormal; Notable for the following components:      Result Value   Leukocytes, UA SMALL (*)    Bacteria, UA RARE (*)    Squamous Epithelial / LPF 0-5 (*)    All other components within normal limits  URINE CULTURE  POC URINE PREG, ED    EKG  EKG Interpretation None       Radiology No results found.  Procedures Procedures (including critical care time)  Medications Ordered in ED Medications - No data to display   Initial Impression / Assessment and Plan / ED Course  I have reviewed the triage vital signs and the nursing notes.  Pertinent labs & imaging results that were available during my care of the patient were reviewed by me  and considered in my medical decision making (see chart for details).     41 yo F with a chief complaint of urinary symptoms and right flank pain.  Patient points to the right lower aspect of her back.  She has no CVA tenderness.  Abdominal exam is benign.  Her urine is not consistent with urinary tract infection.  I discussed this with her, and discussed that my next step typically is to do a pelvic exam.  The patient is currently declining a pelvic exam.  She feels it is all urinary in nature.  I will start her on Keflex presumptively.  Urine sent for culture.  GYN follow-up.  9:42 AM:  I have discussed the diagnosis/risks/treatment options with the patient and believe the pt to be eligible for discharge home to follow-up with Gyn, PCP. We also discussed returning to the ED immediately if new or worsening sx occur. We discussed the sx which are most concerning (e.g., sudden worsening pain, fever, inability to tolerate by mouth) that necessitate immediate return. Medications administered to the patient during their visit and any new prescriptions provided to the patient are listed below.  Medications given during this visit Medications - No data to display   The patient appears reasonably screen and/or stabilized for discharge and I doubt any other medical condition or other [facility] requiring further screening, evaluation, or treatment in the ED at this time prior to discharge.    Final Clinical Impressions(s) / ED Diagnoses   Final diagnoses:  Flank pain  Dysuria    ED Discharge Orders        Ordered    cephALEXin (KEFLEX) 500 MG capsule  4 times daily     09/14/17 0938    phenazopyridine (PYRIDIUM) 200 MG tablet  3 times daily PRN     09/14/17 0938       Melene PlanFloyd, Jocsan Mcginley, DO 09/14/17 [phone]

## 2017-09-15 LAB — URINE CULTURE

## 2017-09-20 ENCOUNTER — Encounter (HOSPITAL_COMMUNITY): Payer: Self-pay

## 2017-09-20 ENCOUNTER — Other Ambulatory Visit: Payer: Self-pay

## 2017-09-20 ENCOUNTER — Emergency Department (HOSPITAL_COMMUNITY)
Admission: EM | Admit: 2017-09-20 | Discharge: 2017-09-21 | Disposition: A | Discharge status: Home / Self Care | Payer: Self-pay | Attending: Emergency Medicine | Admitting: Emergency Medicine

## 2017-09-20 DIAGNOSIS — A5901 Trichomonal vulvovaginitis: Secondary | ICD-10-CM | POA: Insufficient documentation

## 2017-09-20 DIAGNOSIS — N730 Acute parametritis and pelvic cellulitis: Secondary | ICD-10-CM | POA: Insufficient documentation

## 2017-09-20 DIAGNOSIS — R1030 Lower abdominal pain, unspecified: Secondary | ICD-10-CM

## 2017-09-20 LAB — URINALYSIS, ROUTINE W REFLEX MICROSCOPIC
BILIRUBIN URINE: NEGATIVE
Glucose, UA: NEGATIVE mg/dL
Hgb urine dipstick: NEGATIVE
KETONES UR: 5 mg/dL — AB
Nitrite: NEGATIVE
PROTEIN: NEGATIVE mg/dL
SPECIFIC GRAVITY, URINE: 1.015 (ref 1.005–1.030)
pH: 5 (ref 5.0–8.0)

## 2017-09-20 LAB — POC URINE PREG, ED: PREG TEST UR: NEGATIVE

## 2017-09-20 MED ORDER — ONDANSETRON 4 MG PO TBDP
4.0000 mg | ORAL_TABLET | Freq: Once | ORAL | Status: AC
  Administered 2017-09-21: 4 mg via ORAL
  Filled 2017-09-20: qty 1

## 2017-09-20 NOTE — ED Provider Notes (Signed)
[facility]-EMERGENCY DEPT Provider Note   CSN: [id number] Arrival date & time: 09/20/17  1837     History   Chief Complaint Chief Complaint  Patient presents with  . Flank Pain  . Abdominal Pain    lower  . Urinary Frequency    HPI Tanya Stone is a 41 y.o. female who presents today with chief complaint acute onset, progressively worsening abdominal pain and urinary symptoms for 8 days.  She was seen and evaluated 6 days ago with complaint of low back pain and urinary frequency.  UA at that time was not entirely consistent with UTI, and provider suggested pelvic exam. Patient insisted symptoms felt more consistent with prior UTIs, so she was discharged with Keflex and Pyridium which she states she has completed. Provider also recommended OBGYN follow up, but her appointment is not scheduled until next month. Since that visit, she has developed constant cramping lower abdominal pain which radiates to the back. pain worsens with bending and just before urination.  She also notes dysuria.  Denies hematuria.  Endorses nausea but no vomiting.  Denies fevers or chills.  Denies chest pain, shortness of breath, melena, hematochezia, diarrhea, or constipation.  She denies any vaginal pain, itching, or abnormal discharge or bleeding, however states that her relationship has been "rocky for the past few weeks "and requests STD testing, but declines HIV or syphilis testing. She does not use protection with her current female partner of 5 years.   The history is provided by the patient.    History reviewed. No pertinent past medical history.  There are no active problems to display for this patient.   Past Surgical History:  Procedure Laterality Date  . CARPAL TUNNEL RELEASE    . FOOT SURGERY    . TUBAL LIGATION      OB History    Gravida Para Term Preterm AB Living   3 3       3    SAB TAB Ectopic Multiple Live Births           3       Home Medications     Prior to Admission medications   Medication Sig Start Date End Date Taking? Authorizing Provider  phenazopyridine (PYRIDIUM) 200 MG tablet Take 1 tablet (200 mg total) by mouth 3 (three) times daily as needed for pain. 09/14/17  Yes Melene Plan, DO  baclofen (LIORESAL) 10 MG tablet Take 1 tablet (10 mg total) by mouth 3 (three) times daily. Patient not taking: Reported on 09/14/2017 04/04/17   Arthor Captain, PA-C  cephALEXin (KEFLEX) 500 MG capsule Take 1 capsule (500 mg total) by mouth 4 (four) times daily. Patient not taking: Reported on 09/21/2017 09/14/17   Melene Plan, DO  doxycycline (VIBRAMYCIN) 100 MG capsule Take 1 capsule (100 mg total) by mouth 2 (two) times daily for 14 days. 09/21/17 10/05/17  Michela Pitcher A, PA-C  meloxicam (MOBIC) 15 MG tablet Take 1 tablet (15 mg total) by mouth daily. Take 1 daily with food. Patient not taking: Reported on 09/14/2017 04/04/17   Arthor Captain, PA-C  ondansetron (ZOFRAN ODT) 4 MG disintegrating tablet Take 1 tablet (4 mg total) by mouth every 8 (eight) hours as needed for nausea or vomiting. 09/21/17   Jeanie Sewer, PA-C    Family History History reviewed. No pertinent family history.  Social History Social History   Tobacco Use  . Smoking status: Never Smoker  . Smokeless tobacco: Never Used  Substance Use Topics  .  Alcohol use: Yes    Comment: ocassionally  . Drug use: No     Allergies   Patient has no known allergies.   Review of Systems Review of Systems  Constitutional: Negative for chills and fever.  Respiratory: Negative for shortness of breath.   Cardiovascular: Negative for chest pain.  Gastrointestinal: Positive for abdominal pain and nausea. Negative for blood in stool, constipation, diarrhea and vomiting.  Genitourinary: Positive for dysuria, frequency and urgency. Negative for hematuria, vaginal bleeding, vaginal discharge and vaginal pain.  Musculoskeletal: Positive for back pain.  All other systems reviewed and  are negative.    Physical Exam Updated Vital Signs BP 117/65 (BP Location: Left Arm)   Pulse 80   Temp 98.8 F (37.1 C) (Oral)   Resp 14   Ht 6\' 1"  (1.854 m)   Wt 111.1 kg (245 lb)   LMP 08/28/2017   SpO2 100%   BMI 32.32 kg/m   Physical Exam  Constitutional: She appears well-developed and well-nourished. No distress.  HENT:  Head: Normocephalic and atraumatic.  Eyes: Conjunctivae are normal. Right eye exhibits no discharge. Left eye exhibits no discharge.  Neck: No JVD present. No tracheal deviation present.  Cardiovascular: Normal rate, regular rhythm and normal heart sounds.  Pulmonary/Chest: Effort normal and breath sounds normal.  Abdominal: Soft. Bowel sounds are normal. She exhibits no distension. There is tenderness in the right lower quadrant, suprapubic area and left lower quadrant. There is CVA tenderness. There is no rigidity, no rebound, no guarding, no tenderness at McBurney's point and negative Murphy's sign.  Bilateral CVA tenderness, left worse than right  Genitourinary: Rectum normal, vagina normal and uterus normal. Uterus is not tender. Cervix exhibits motion tenderness. Right adnexum displays no mass and no tenderness. Left adnexum displays no mass and no tenderness. No tenderness or bleeding in the vagina.  Genitourinary Comments: Cervix is normal in appearance. Small amount of yellow-white discharge in the vaginal vault. No bleeding. No masses or lesions to the vaginal wall or external genitalia.   Musculoskeletal: She exhibits no edema.  Neurological: She is alert.  Skin: Skin is warm and dry. No erythema.  Psychiatric: She has a normal mood and affect. Her behavior is normal.  Nursing note and vitals reviewed.    ED Treatments / Results  Labs (all labs ordered are listed, but only abnormal results are displayed) Labs Reviewed  WET PREP, GENITAL - Abnormal; Notable for the following components:      Result Value   Trich, Wet Prep POSITIVE (*)     WBC, Wet Prep HPF POC MODERATE (*)    All other components within normal limits  URINALYSIS, ROUTINE W REFLEX MICROSCOPIC - Abnormal; Notable for the following components:   APPearance CLOUDY (*)    Ketones, ur 5 (*)    Leukocytes, UA LARGE (*)    Bacteria, UA RARE (*)    Squamous Epithelial / LPF TOO NUMEROUS TO COUNT (*)    All other components within normal limits  COMPREHENSIVE METABOLIC PANEL - Abnormal; Notable for the following components:   Total Protein 8.2 (*)    All other components within normal limits  URINE CULTURE  LIPASE, BLOOD  CBC WITH DIFFERENTIAL/PLATELET  POC URINE PREG, ED  GC/CHLAMYDIA PROBE AMP ([place]) NOT AT [facility]    EKG  EKG Interpretation None       Radiology No results found.  Procedures Procedures (including critical care time)  Medications Ordered in ED Medications  metroNIDAZOLE (FLAGYL) tablet 2,000  mg (not administered)  cefTRIAXone (ROCEPHIN) injection 250 mg (not administered)  doxycycline (VIBRA-TABS) tablet 100 mg (not administered)  oxyCODONE-acetaminophen (PERCOCET/ROXICET) 5-325 MG per tablet 1 tablet (not administered)  lidocaine (PF) (XYLOCAINE) 1 % injection (not administered)  ondansetron (ZOFRAN-ODT) disintegrating tablet 4 mg (4 mg Oral Given 09/21/17 0011)     Initial Impression / Assessment and Plan / ED Course  I have reviewed the triage vital signs and the nursing notes.  Pertinent labs & imaging results that were available during my care of the patient were reviewed by me and considered in my medical decision making (see chart for details).     Patient with worsening urinary symptoms abdominal pain, and back pain.  She has bilateral paralumbar muscle tenderness and lower abdominal tenderness to palpation.  Afebrile, vital signs are stable.  She is nontoxic in appearance.  No leukocytosis, and remainder of lab work is reassuring.  UA is not a clean-catch, however shows too numerous to count WBCs and RBCs.  Pelvic  examination is concerning for PID with cervical motion tenderness. Suspect UA possibly contaminated by vaginal fluids.  Wet prep positive for Trichomonas.  Will treat with Flagyl 2 g in the ED today as well as Rocephin and 2-week course of doxycycline for PID.  I doubt obstruction, perforation, appendicitis, TOA, or ovarian torsion.  Low suspicion of other acute surgical abdominal pathology.  She has follow-up scheduled with her OB/GYN in the near future.  Discussed indications for return to the ED. Pt verbalized understanding of and agreement with plan and is safe for discharge home at this time.   Final Clinical Impressions(s) / ED Diagnoses   Final diagnoses:  Trichomonas vaginitis  PID (acute pelvic inflammatory disease)  Lower abdominal pain    ED Discharge Orders        Ordered    doxycycline (VIBRAMYCIN) 100 MG capsule  2 times daily     09/21/17 0115    ondansetron (ZOFRAN ODT) 4 MG disintegrating tablet  Every 8 hours PRN     09/21/17 0115      Jeanie SewerFawze, Senna Lape A, PA-C 09/21/17 0135  Shaune PollackIsaacs, Cameron, MD 09/21/17 1215

## 2017-09-20 NOTE — ED Triage Notes (Signed)
Pt reporting bilateral flank pain and lower abdominal pain accompanied by urinary frequency. She denies any burning or pain with urination. She endorses nausea, but denies vomiting or diarrhea. A&Ox4.

## 2017-09-21 LAB — WET PREP, GENITAL
Clue Cells Wet Prep HPF POC: NONE SEEN
Sperm: NONE SEEN
Trich, Wet Prep: POSITIVE — AB
Yeast Wet Prep HPF POC: NONE SEEN

## 2017-09-21 LAB — COMPREHENSIVE METABOLIC PANEL
ALT: 23 U/L (ref 14–54)
AST: 24 U/L (ref 15–41)
Albumin: 4.4 g/dL (ref 3.5–5.0)
Alkaline Phosphatase: 50 U/L (ref 38–126)
Anion gap: 7 (ref 5–15)
BUN: 10 mg/dL (ref 6–20)
CO2: 24 mmol/L (ref 22–32)
Calcium: 9.7 mg/dL (ref 8.9–10.3)
Chloride: 109 mmol/L (ref 101–111)
Creatinine, Ser: 0.89 mg/dL (ref 0.44–1.00)
GFR calc Af Amer: >60 mL/min (ref >60)
GFR calc non Af Amer: >60 mL/min (ref >60)
Glucose, Bld: 93 mg/dL (ref 65–99)
Potassium: 3.7 mmol/L (ref 3.5–5.1)
Sodium: 140 mmol/L (ref 135–145)
Total Bilirubin: 0.8 mg/dL (ref 0.3–1.2)
Total Protein: 8.2 g/dL — ABNORMAL HIGH (ref 6.5–8.1)

## 2017-09-21 LAB — GC/CHLAMYDIA PROBE AMP (~~LOC~~) NOT AT ARMC
Chlamydia: NEGATIVE
Neisseria Gonorrhea: NEGATIVE

## 2017-09-21 LAB — CBC WITH DIFFERENTIAL/PLATELET
Basophils Absolute: 0 10*3/uL (ref 0.0–0.1)
Basophils Relative: 0 %
Eosinophils Absolute: 0.1 10*3/uL (ref 0.0–0.7)
Eosinophils Relative: 2 %
HCT: 37.1 % (ref 36.0–46.0)
Hemoglobin: 12.6 g/dL (ref 12.0–15.0)
Lymphocytes Relative: 45 %
Lymphs Abs: 2.6 10*3/uL (ref 0.7–4.0)
MCH: 28.8 pg (ref 26.0–34.0)
MCHC: 34 g/dL (ref 30.0–36.0)
MCV: 84.7 fL (ref 78.0–100.0)
Monocytes Absolute: 0.5 10*3/uL (ref 0.1–1.0)
Monocytes Relative: 8 %
Neutro Abs: 2.6 10*3/uL (ref 1.7–7.7)
Neutrophils Relative %: 45 %
Platelets: 271 10*3/uL (ref 150–400)
RBC: 4.38 MIL/uL (ref 3.87–5.11)
RDW: 15 % (ref 11.5–15.5)
WBC: 5.8 10*3/uL (ref 4.0–10.5)

## 2017-09-21 LAB — LIPASE, BLOOD: Lipase: 29 U/L (ref 11–51)

## 2017-09-21 MED ORDER — DOXYCYCLINE HYCLATE 100 MG PO TABS
100.0000 mg | ORAL_TABLET | Freq: Once | ORAL | Status: AC
  Administered 2017-09-21: 100 mg via ORAL
  Filled 2017-09-21: qty 1

## 2017-09-21 MED ORDER — OXYCODONE-ACETAMINOPHEN 5-325 MG PO TABS
1.0000 | ORAL_TABLET | Freq: Once | ORAL | Status: AC
  Administered 2017-09-21: 1 via ORAL
  Filled 2017-09-21: qty 1

## 2017-09-21 MED ORDER — DOXYCYCLINE HYCLATE 100 MG PO CAPS: 100.0000 mg | ORAL_CAPSULE | Freq: Two times a day (BID) | ORAL | 0 refills | Status: AC

## 2017-09-21 MED ORDER — ONDANSETRON 4 MG PO TBDP: 4.0000 mg | ORAL_TABLET | Freq: Three times a day (TID) | ORAL | 0 refills | Status: DC | PRN

## 2017-09-21 MED ORDER — METRONIDAZOLE 500 MG PO TABS
2000.0000 mg | ORAL_TABLET | Freq: Once | ORAL | Status: AC
  Administered 2017-09-21: 2000 mg via ORAL
  Filled 2017-09-21: qty 4

## 2017-09-21 MED ORDER — CEFTRIAXONE SODIUM 250 MG IJ SOLR
250.0000 mg | Freq: Once | INTRAMUSCULAR | Status: AC
  Administered 2017-09-21: 250 mg via INTRAMUSCULAR
  Filled 2017-09-21: qty 250

## 2017-09-21 MED ORDER — LIDOCAINE HCL (PF) 1 % IJ SOLN
INTRAMUSCULAR | Status: AC
  Administered 2017-09-21: 5 mL
  Filled 2017-09-21: qty 5

## 2017-09-21 NOTE — Discharge Instructions (Signed)
Please take all of your antibiotics until finished!   You may develop abdominal discomfort or diarrhea from the antibiotic.  You may help offset this with probiotics which you can buy or get in yogurt. Do not eat  or take the probiotics until 2 hours after your antibiotic.   Alternate 600 mg of ibuprofen and [phone] mg of Tylenol every 3 hours as needed for pain. Do not exceed 4000 mg of Tylenol daily.  Take zofran for nausea.  Follow-up with your OB/GYN as scheduled for reevaluation.  Return to the ED if any concerning signs or symptoms develop such as fevers, worsening pain, blood in the urine or stool, or if you are unable to keep food down.

## 2017-09-22 LAB — URINE CULTURE: Culture: 20000 — AB

## 2017-09-23 ENCOUNTER — Telehealth: Payer: Self-pay

## 2017-09-23 NOTE — Telephone Encounter (Signed)
No treatment needed for UC 09/21/17 Per Page SpiroElizabeth Hammon PA-C

## 2017-09-24 ENCOUNTER — Encounter (HOSPITAL_COMMUNITY): Payer: Self-pay | Admitting: Emergency Medicine

## 2017-09-24 ENCOUNTER — Emergency Department (HOSPITAL_COMMUNITY)
Admission: EM | Admit: 2017-09-24 | Discharge: 2017-09-24 | Disposition: A | Discharge status: Home / Self Care | Payer: Medicaid Other | Attending: Emergency Medicine | Admitting: Emergency Medicine

## 2017-09-24 ENCOUNTER — Emergency Department (HOSPITAL_COMMUNITY): Payer: Medicaid Other

## 2017-09-24 ENCOUNTER — Other Ambulatory Visit: Payer: Self-pay

## 2017-09-24 DIAGNOSIS — R509 Fever, unspecified: Secondary | ICD-10-CM | POA: Insufficient documentation

## 2017-09-24 DIAGNOSIS — R69 Illness, unspecified: Secondary | ICD-10-CM

## 2017-09-24 DIAGNOSIS — R51 Headache: Secondary | ICD-10-CM | POA: Insufficient documentation

## 2017-09-24 DIAGNOSIS — M7918 Myalgia, other site: Secondary | ICD-10-CM | POA: Insufficient documentation

## 2017-09-24 DIAGNOSIS — R0981 Nasal congestion: Secondary | ICD-10-CM | POA: Insufficient documentation

## 2017-09-24 DIAGNOSIS — J111 Influenza due to unidentified influenza virus with other respiratory manifestations: Secondary | ICD-10-CM

## 2017-09-24 DIAGNOSIS — R05 Cough: Secondary | ICD-10-CM | POA: Insufficient documentation

## 2017-09-24 MED ORDER — KETOROLAC TROMETHAMINE 60 MG/2ML IM SOLN
60.0000 mg | Freq: Once | INTRAMUSCULAR | Status: AC
  Administered 2017-09-24: 60 mg via INTRAMUSCULAR
  Filled 2017-09-24: qty 2

## 2017-09-24 MED ORDER — PROMETHAZINE-DM 6.25-15 MG/5ML PO SYRP: 5.0000 mL | ORAL_SOLUTION | Freq: Four times a day (QID) | ORAL | 0 refills | Status: DC | PRN

## 2017-09-24 MED ORDER — LIDOCAINE VISCOUS 2 % MT SOLN: 15.0000 mL | OROMUCOSAL | 0 refills | Status: DC | PRN

## 2017-09-24 MED ORDER — ACETAMINOPHEN 325 MG PO TABS
650.0000 mg | ORAL_TABLET | Freq: Once | ORAL | Status: AC | PRN
  Administered 2017-09-24: 650 mg via ORAL
  Filled 2017-09-24: qty 2

## 2017-09-24 NOTE — Discharge Instructions (Signed)
Take 1000 mg of Tylenol or 800 mg of ibuprofen with food once every 8 hours for pain, body aches, and fever control.  You can swallow 15 mL of viscous lidocaine once every 3 hours as needed for sore throat.  Please do not use this medication more than directed because of side effects.  Take 5 mL of promethazine dextromethorphan every 6 hours as needed for cough and nasal congestion.  Is important to stay hydrated so continue to drink plenty of water.  It can take up to a week before  your symptoms significantly improve.  Continue to take all of your doxycycline at home even if you are feeling better.   Please make sure to wash your hands and cover your mouth when you cough to avoid spread of infection.  If you develop new or worsening symptoms, including fever that does not improve with Tylenol, vomiting and diarrhea, severe difficulty breathing, or other concerning symptoms, please return to the emergency department for reevaluation.

## 2017-09-24 NOTE — ED Provider Notes (Signed)
[facility]-EMERGENCY DEPT Provider Note   CSN: [id number] Arrival date & time: 09/24/17  1620     History   Chief Complaint Chief Complaint  Patient presents with  . Cough    HPI Tanya Stone is a 41 y.o. female who presents to the emergency department with a chief complaint of productive cough with clear and light red sputum, nasal congestion, fever, headache, nasal congestion, and body aches x2 days.  She denies neck pain or stiffness, nausea, vomiting, diarrhea, abdominal pain, shortness of breath, or chest pain.  Tmax 101.7 at 16:15 PM.  No treatment prior to arrival.  Her symptoms have been constant since onset and have continued to worsen.  No aggravating or alleviating factors.  She reports good fluid and food intake today.  She was seen in the ED on 09/20/2017 for abdominal pain and urinary symptoms for 8 days.  She tested positive for trichomonas and has cervical motion tenderness on exam so she was treated with Rocephin and doxycycline for PID.  She reports that all of her symptoms associated with her previous visit have resolved.  She has been compliant with doxycycline at home.  She has not had a flu shot this year.  The history is provided by the patient. No language interpreter was used.    History reviewed. No pertinent past medical history.  There are no active problems to display for this patient.   Past Surgical History:  Procedure Laterality Date  . CARPAL TUNNEL RELEASE    . FOOT SURGERY    . TUBAL LIGATION      OB History    Gravida Para Term Preterm AB Living   3 3       3    SAB TAB Ectopic Multiple Live Births           3       Home Medications    Prior to Admission medications   Medication Sig Start Date End Date Taking? Authorizing Provider  baclofen (LIORESAL) 10 MG tablet Take 1 tablet (10 mg total) by mouth 3 (three) times daily. Patient not taking: Reported on 09/14/2017 04/04/17   Arthor Captain, PA-C    cephALEXin (KEFLEX) 500 MG capsule Take 1 capsule (500 mg total) by mouth 4 (four) times daily. Patient not taking: Reported on 09/21/2017 09/14/17   Melene Plan, DO  doxycycline (VIBRAMYCIN) 100 MG capsule Take 1 capsule (100 mg total) by mouth 2 (two) times daily for 14 days. 09/21/17 10/05/17  Michela Pitcher A, PA-C  lidocaine (XYLOCAINE) 2 % solution Use as directed 15 mLs in the mouth or throat as needed for mouth pain. 09/24/17   McDonald, Mia A, PA-C  meloxicam (MOBIC) 15 MG tablet Take 1 tablet (15 mg total) by mouth daily. Take 1 daily with food. Patient not taking: Reported on 09/14/2017 04/04/17   Arthor Captain, PA-C  ondansetron (ZOFRAN ODT) 4 MG disintegrating tablet Take 1 tablet (4 mg total) by mouth every 8 (eight) hours as needed for nausea or vomiting. 09/21/17   Michela Pitcher A, PA-C  phenazopyridine (PYRIDIUM) 200 MG tablet Take 1 tablet (200 mg total) by mouth 3 (three) times daily as needed for pain. 09/14/17   Melene Plan, DO  promethazine-dextromethorphan (PROMETHAZINE-DM) 6.25-15 MG/5ML syrup Take 5 mLs by mouth 4 (four) times daily as needed for cough. 09/24/17   McDonald, Pedro Earls A, PA-C    Family History No family history on file.  Social History Social History   Tobacco Use  .  Smoking status: Never Smoker  . Smokeless tobacco: Never Used  Substance Use Topics  . Alcohol use: Yes    Comment: ocassionally  . Drug use: No     Allergies   Patient has no known allergies.   Review of Systems Review of Systems  Constitutional: Positive for chills and fever. Negative for activity change and appetite change.  HENT: Positive for congestion, ear pain and sore throat. Negative for dental problem, facial swelling, sinus pressure and sinus pain.   Respiratory: Positive for cough. Negative for shortness of breath.   Cardiovascular: Negative for chest pain.  Gastrointestinal: Negative for abdominal pain, diarrhea, nausea and vomiting.  Genitourinary: Negative for dysuria,  urgency and vaginal pain.  Musculoskeletal: Negative for back pain, neck pain and neck stiffness.  Skin: Negative for rash.  Neurological: Positive for headaches. Negative for dizziness, weakness and light-headedness.     Physical Exam Updated Vital Signs BP 138/78 (BP Location: Right Arm)   Pulse 96   Temp 99.8 F (37.7 C) (Oral)   Resp 20   Ht 6\' 1"  (1.854 m)   Wt 111.1 kg (245 lb)   LMP 08/28/2017   SpO2 100%   BMI 32.32 kg/m   Physical Exam  Constitutional:  Non-toxic appearance. No distress.  HENT:  Head: Normocephalic.  Right Ear: A middle ear effusion is present.  Left Ear: A middle ear effusion is present.  Nose: Rhinorrhea present. No mucosal edema. Right sinus exhibits no maxillary sinus tenderness and no frontal sinus tenderness. Left sinus exhibits no maxillary sinus tenderness and no frontal sinus tenderness.  Mouth/Throat: Uvula is midline and mucous membranes are normal. Posterior oropharyngeal erythema present. No oropharyngeal exudate, posterior oropharyngeal edema or tonsillar abscesses. No tonsillar exudate.  Eyes: Conjunctivae are normal.  Neck: Normal range of motion. Neck supple.  No meningeal signs.  Cardiovascular: Normal rate and regular rhythm. Exam reveals no gallop and no friction rub.  No murmur heard. Pulmonary/Chest: Effort normal. No stridor. No respiratory distress. She has no wheezes. She has no rales. She exhibits no tenderness.  Abdominal: Soft. Bowel sounds are normal. She exhibits no distension and no mass. There is no tenderness. There is no rebound and no guarding. No hernia.  Neurological: She is alert.  Skin: Skin is warm. No rash noted.  Psychiatric: Her behavior is normal.  Nursing note and vitals reviewed.    ED Treatments / Results  Labs (all labs ordered are listed, but only abnormal results are displayed) Labs Reviewed - No data to display  EKG  EKG Interpretation None       Radiology Dg Chest 2 View  Result  Date: 09/24/2017 CLINICAL DATA:  Productive cough, congestion, chest pain EXAM: CHEST  2 VIEW COMPARISON:  03/07/2017 FINDINGS: Heart and mediastinal contours are within normal limits. No focal opacities or effusions. No acute bony abnormality. IMPRESSION: No active cardiopulmonary disease. Electronically Signed   By: Charlett NoseKevin  Dover M.D.   On: 09/24/2017 18:16    Procedures Procedures (including critical care time)  Medications Ordered in ED Medications  acetaminophen (TYLENOL) tablet 650 mg (650 mg Oral Given 09/24/17 1637)  ketorolac (TORADOL) injection 60 mg (60 mg Intramuscular Given 09/24/17 1906)     Initial Impression / Assessment and Plan / ED Course  I have reviewed the triage vital signs and the nursing notes.  Pertinent labs & imaging results that were available during my care of the patient were reviewed by me and considered in my medical decision making (see chart  for details).     Patient with symptoms consistent with influenza.  Vitals are stable, fever resolved.  No signs of dehydration, tolerating PO's.  Lungs are clear. CXR was negative for influenza Discussed the cost versus benefit of influenza testing and Tamiflu treatment with the patient. She declines both at this time. Patient will be discharged with instructions to orally hydrate, rest, and use over-the-counter medications such as anti-inflammatories ibuprofen and Aleve for muscle aches and Tylenol for fever.  Patient will also be given a cough suppressant and viscous lidocaine.  The patient was last seen on 09/20/2017 and was started on doxycycline for PID.  She reports all of these symptoms have resolved.  She is hemodynamically stable, afebrile prior to discharge.  No acute distress.  Nontoxic-appearing. The patient is safe for discharge at this time.  Final Clinical Impressions(s) / ED Diagnoses   Final diagnoses:  Influenza-like illness    ED Discharge Orders        Ordered    promethazine-dextromethorphan  (PROMETHAZINE-DM) 6.25-15 MG/5ML syrup  4 times daily PRN     09/24/17 1907    lidocaine (XYLOCAINE) 2 % solution  As needed     09/24/17 1907       Barkley BoardsMcDonald, Mia A, PA-C 09/24/17 2113    Tegeler, Canary Brimhristopher J, MD 09/25/17 [phone]

## 2017-09-24 NOTE — ED Triage Notes (Signed)
Patient c/o productive cough, congestion, and body aches x2 days after waiting in the lobby to be seen. Denies N/V/D. Febrile in triage.

## 2017-09-24 NOTE — ED Notes (Addendum)
Pt is alert and oriented x 4 and reports that she has had a cough and sore throat with a fever x 2 days. Pt reports headache and chest discomfort from constantly coughing. Pt does reports a productive cough with white phlegm. Temp decreased to 100.8 orally.

## 2017-10-04 ENCOUNTER — Ambulatory Visit: Payer: Self-pay | Admitting: Obstetrics

## 2018-01-30 ENCOUNTER — Encounter (HOSPITAL_COMMUNITY): Payer: Self-pay | Admitting: Emergency Medicine

## 2018-01-30 ENCOUNTER — Emergency Department (HOSPITAL_COMMUNITY): Payer: Medicaid Other

## 2018-01-30 ENCOUNTER — Emergency Department (HOSPITAL_COMMUNITY)
Admission: EM | Admit: 2018-01-30 | Discharge: 2018-01-30 | Disposition: A | Discharge status: Home / Self Care | Payer: Medicaid Other | Attending: Emergency Medicine | Admitting: Emergency Medicine

## 2018-01-30 DIAGNOSIS — F17210 Nicotine dependence, cigarettes, uncomplicated: Secondary | ICD-10-CM | POA: Insufficient documentation

## 2018-01-30 DIAGNOSIS — R109 Unspecified abdominal pain: Secondary | ICD-10-CM | POA: Insufficient documentation

## 2018-01-30 LAB — WET PREP, GENITAL
CLUE CELLS WET PREP: NONE SEEN
SPERM: NONE SEEN
Trich, Wet Prep: NONE SEEN
Yeast Wet Prep HPF POC: NONE SEEN

## 2018-01-30 LAB — URINALYSIS, ROUTINE W REFLEX MICROSCOPIC
BILIRUBIN URINE: NEGATIVE
GLUCOSE, UA: NEGATIVE mg/dL
HGB URINE DIPSTICK: NEGATIVE
KETONES UR: NEGATIVE mg/dL
Leukocytes, UA: NEGATIVE
Nitrite: NEGATIVE
PH: 7 (ref 5.0–8.0)
Protein, ur: NEGATIVE mg/dL
SPECIFIC GRAVITY, URINE: 1.01 (ref 1.005–1.030)

## 2018-01-30 LAB — I-STAT BETA HCG BLOOD, ED (MC, WL, AP ONLY): I-stat hCG, quantitative: <5 m[IU]/mL (ref <5)

## 2018-01-30 LAB — COMPREHENSIVE METABOLIC PANEL
ALT: 18 U/L (ref 14–54)
ANION GAP: 9 (ref 5–15)
AST: 21 U/L (ref 15–41)
Albumin: 4 g/dL (ref 3.5–5.0)
Alkaline Phosphatase: 47 U/L (ref 38–126)
BILIRUBIN TOTAL: 0.9 mg/dL (ref 0.3–1.2)
BUN: 7 mg/dL (ref 6–20)
CO2: 24 mmol/L (ref 22–32)
Calcium: 9.5 mg/dL (ref 8.9–10.3)
Chloride: 107 mmol/L (ref 101–111)
Creatinine, Ser: 1.06 mg/dL — ABNORMAL HIGH (ref 0.44–1.00)
GFR calc Af Amer: >60 mL/min (ref >60)
GFR calc non Af Amer: >60 mL/min (ref >60)
GLUCOSE: 113 mg/dL — AB (ref 65–99)
POTASSIUM: 3.8 mmol/L (ref 3.5–5.1)
SODIUM: 140 mmol/L (ref 135–145)
Total Protein: 7.5 g/dL (ref 6.5–8.1)

## 2018-01-30 LAB — CBC
HEMATOCRIT: 34.3 % — AB (ref 36.0–46.0)
HEMOGLOBIN: 12 g/dL (ref 12.0–15.0)
MCH: 30 pg (ref 26.0–34.0)
MCHC: 35 g/dL (ref 30.0–36.0)
MCV: 85.8 fL (ref 78.0–100.0)
Platelets: 257 10*3/uL (ref 150–400)
RBC: 4 MIL/uL (ref 3.87–5.11)
RDW: 15 % (ref 11.5–15.5)
WBC: 4.8 10*3/uL (ref 4.0–10.5)

## 2018-01-30 LAB — LIPASE, BLOOD: Lipase: 29 U/L (ref 11–51)

## 2018-01-30 NOTE — ED Notes (Signed)
Pt is alert and oriented updated pt to make aware that she is waiting for a abdominal U/S.

## 2018-01-30 NOTE — ED Triage Notes (Signed)
Pt c/o lower abd pains for 2 weeks that has been constant. Reports pressure when urinates. Denies n/v/d.

## 2018-01-30 NOTE — ED Notes (Signed)
Ultrasound arrived to pt room

## 2018-01-30 NOTE — Discharge Instructions (Addendum)
It was our pleasure to provide your ER care today - we hope that you feel better.  You may take motrin or aleve as need for pain.   Follow up with primary care doctor for recheck in the next few days if symptoms fail to improve/resolve.  Return to ER if worse, new symptoms, fevers, worsening or severe pain, persistent vomiting, other concern.

## 2018-01-30 NOTE — ED Provider Notes (Addendum)
[facility]-EMERGENCY DEPT Provider Note   CSN: [id number] Arrival date & time: 01/30/18  0841     History   Chief Complaint Chief Complaint  Patient presents with  . Abdominal Pain    HPI Tanya Stone is a 42 y.o. female.  Patient c/o right sided abd pain for the past 1-2 weeks. Worse when urinates, but then says it is transiently better. Pain dull, moderate, persistent, non radiating. Had normal period 2 weeks ago. Denies vaginal bleeding or discharge. Denies fever or chills. Normal appetite. No vomiting or diarrhea. Had bm yesterday. Denies trauma to area. No hx gallstones. No prior abd surgery. Denies cough or uri symptoms. No sob.   The history is provided by the patient.  Abdominal Pain   Pertinent negatives include fever, diarrhea, vomiting, constipation, dysuria and headaches.    History reviewed. No pertinent past medical history.  There are no active problems to display for this patient.   Past Surgical History:  Procedure Laterality Date  . CARPAL TUNNEL RELEASE    . FOOT SURGERY    . TUBAL LIGATION       OB History    Gravida  3   Para  3   Term      Preterm      AB      Living  3     SAB      TAB      Ectopic      Multiple      Live Births  3            Home Medications    Prior to Admission medications   Medication Sig Start Date End Date Taking? Authorizing Provider  baclofen (LIORESAL) 10 MG tablet Take 1 tablet (10 mg total) by mouth 3 (three) times daily. Patient not taking: Reported on 09/14/2017 04/04/17   Arthor Captain, PA-C  cephALEXin (KEFLEX) 500 MG capsule Take 1 capsule (500 mg total) by mouth 4 (four) times daily. Patient not taking: Reported on 09/21/2017 09/14/17   Melene Plan, DO  lidocaine (XYLOCAINE) 2 % solution Use as directed 15 mLs in the mouth or throat as needed for mouth pain. Patient not taking: Reported on 01/30/2018 09/24/17   McDonald, Pedro Earls A, PA-C  meloxicam (MOBIC) 15 MG  tablet Take 1 tablet (15 mg total) by mouth daily. Take 1 daily with food. Patient not taking: Reported on 09/14/2017 04/04/17   Arthor Captain, PA-C  ondansetron (ZOFRAN ODT) 4 MG disintegrating tablet Take 1 tablet (4 mg total) by mouth every 8 (eight) hours as needed for nausea or vomiting. Patient not taking: Reported on 01/30/2018 09/21/17   Michela Pitcher A, PA-C  phenazopyridine (PYRIDIUM) 200 MG tablet Take 1 tablet (200 mg total) by mouth 3 (three) times daily as needed for pain. Patient not taking: Reported on 01/30/2018 09/14/17   Melene Plan, DO  promethazine-dextromethorphan (PROMETHAZINE-DM) 6.25-15 MG/5ML syrup Take 5 mLs by mouth 4 (four) times daily as needed for cough. Patient not taking: Reported on 01/30/2018 09/24/17   Barkley Boards, PA-C    Family History No family history on file.  Social History Social History   Tobacco Use  . Smoking status: Current Every Day Smoker    Types: Cigars  . Smokeless tobacco: Never Used  Substance Use Topics  . Alcohol use: Yes    Comment: ocassionally  . Drug use: No     Allergies   Patient has no known allergies.   Review of  Systems Review of Systems  Constitutional: Negative for fever.  HENT: Negative for sore throat.   Eyes: Negative for redness.  Respiratory: Negative for cough and shortness of breath.   Cardiovascular: Negative for chest pain.  Gastrointestinal: Positive for abdominal pain. Negative for constipation, diarrhea and vomiting.  Genitourinary: Negative for dysuria.  Musculoskeletal: Negative for back pain and neck pain.  Skin: Negative for rash.  Neurological: Negative for headaches.  Hematological: Does not bruise/bleed easily.  Psychiatric/Behavioral: Negative for confusion.     Physical Exam Updated Vital Signs BP (!) 132/92 (BP Location: Left Arm)   Pulse 85   Temp 98.4 F (36.9 C) (Oral)   Resp 17   Ht 1.676 m ( )   Wt 113.9 kg (251 lb)   LMP 01/15/2018   SpO2 100%   BMI 40.51 kg/m     Physical Exam  Constitutional: She appears well-developed and well-nourished. No distress.  Eyes: Conjunctivae are normal. No scleral icterus.  Neck: Neck supple. No tracheal deviation present.  Cardiovascular: Normal rate, regular rhythm, normal heart sounds and intact distal pulses. Exam reveals no gallop and no friction rub.  No murmur heard. Pulmonary/Chest: Effort normal and breath sounds normal. No respiratory distress.  Abdominal: Soft. Normal appearance and bowel sounds are normal. She exhibits no distension and no mass. There is no tenderness. There is no rebound and no guarding. No hernia.  Genitourinary:  Genitourinary Comments: No cva tenderness. Normal external gu exam. Cervix closed. No cmt. No adx masses or tenderness.   Musculoskeletal: She exhibits no edema.  Neurological: She is alert.  Skin: Skin is warm and dry. No rash noted. She is not diaphoretic.  Psychiatric: She has a normal mood and affect.  Nursing note and vitals reviewed.    ED Treatments / Results  Labs (all labs ordered are listed, but only abnormal results are displayed) Results for orders placed or performed during the hospital encounter of 01/30/18  Wet prep, genital  Result Value Ref Range   Yeast Wet Prep HPF POC NONE SEEN NONE SEEN   Trich, Wet Prep NONE SEEN NONE SEEN   Clue Cells Wet Prep HPF POC NONE SEEN NONE SEEN   WBC, Wet Prep HPF POC RARE (A) NONE SEEN   Sperm NONE SEEN   Lipase, blood  Result Value Ref Range   Lipase 29 11 - 51 U/L  Comprehensive metabolic panel  Result Value Ref Range   Sodium 140 135 - 145 mmol/L   Potassium 3.8 3.5 - 5.1 mmol/L   Chloride 107 101 - 111 mmol/L   CO2 24 22 - 32 mmol/L   Glucose, Bld 113 (H) 65 - 99 mg/dL   BUN 7 6 - 20 mg/dL   Creatinine, Ser 4.09 (H) 0.44 - 1.00 mg/dL   Calcium 9.5 8.9 - 81.1 mg/dL   Total Protein 7.5 6.5 - 8.1 g/dL   Albumin 4.0 3.5 - 5.0 g/dL   AST 21 15 - 41 U/L   ALT 18 14 - 54 U/L   Alkaline Phosphatase 47 38 -  126 U/L   Total Bilirubin 0.9 0.3 - 1.2 mg/dL   GFR calc non Af Amer >60 >60 mL/min   GFR calc Af Amer >60 >60 mL/min   Anion gap 9 5 - 15  CBC  Result Value Ref Range   WBC 4.8 4.0 - 10.5 K/uL   RBC 4.00 3.87 - 5.11 MIL/uL   Hemoglobin 12.0 12.0 - 15.0 g/dL   HCT 91.4 (L) 78.2 -  46.0 %   MCV 85.8 78.0 - 100.0 fL   MCH 30.0 26.0 - 34.0 pg   MCHC 35.0 30.0 - 36.0 g/dL   RDW 16.1 09.6 - 04.5 %   Platelets 257 150 - 400 K/uL  Urinalysis, Routine w reflex microscopic  Result Value Ref Range   Color, Urine YELLOW YELLOW   APPearance CLEAR CLEAR   Specific Gravity, Urine 1.010 1.005 - 1.030   pH 7.0 5.0 - 8.0   Glucose, UA NEGATIVE NEGATIVE mg/dL   Hgb urine dipstick NEGATIVE NEGATIVE   Bilirubin Urine NEGATIVE NEGATIVE   Ketones, ur NEGATIVE NEGATIVE mg/dL   Protein, ur NEGATIVE NEGATIVE mg/dL   Nitrite NEGATIVE NEGATIVE   Leukocytes, UA NEGATIVE NEGATIVE  I-Stat beta hCG blood, ED  Result Value Ref Range   I-stat hCG, quantitative <5.0 <5 mIU/mL   Comment 3            EKG None  Radiology US Abdomen Complete  Result Date: 01/30/2018 CLINICAL DATA:  Rights flank pain. Suspect urinary tract obstruction or gallstones. EXAM: ABDOMEN ULTRASOUND COMPLETE COMPARISON:  Abdominopelvic CT scan of June 21, 2011 FINDINGS: Gallbladder: No gallstones or wall thickening visualized. No sonographic Murphy sign noted by sonographer. Common bile duct: Diameter: 4 mm Liver: No focal lesion identified. Within normal limits in parenchymal echogenicity. Portal vein is patent on color Doppler imaging with normal direction of blood flow towards the liver. IVC: No abnormality visualized. Pancreas: Visualized portion unremarkable. Spleen: Size and appearance within normal limits. Right Kidney: Length: 10.9 cm. Echogenicity within normal limits. No mass or hydronephrosis visualized. Left Kidney: Length: 11.9 cm. Echogenicity within normal limits. No mass or hydronephrosis visualized. Abdominal aorta: No  aneurysm visualized. Other findings: There is no ascites. IMPRESSION: No gallstones or sonographic evidence of acute cholecystitis. No calcified kidney stones nor hydronephrosis. No acute intra-abdominal abnormality is observed. Electronically Signed   By: David  [place] M.D.   On: 01/30/2018 11:51    Procedures Procedures (including critical care time)  Medications Ordered in ED Medications - No data to display   Initial Impression / Assessment and Plan / ED Course  I have reviewed the triage vital signs and the nursing notes.  Pertinent labs & imaging results that were available during my care of the patient were reviewed by me and considered in my medical decision making (see chart for details).  Labs sent.  Reviewed nursing notes and prior charts for additional history.   Labs reviewed - no uti.   U/s reviewed -  No gallstones, no hydroneph.   Recheck abd soft non tender. Afebrile.  Patient abd exam benign, afebrile, and currently appears stable for d/c.   Pt requests female exam as new to area.   Pelvic exam unremarkable. Wet prep neg.   Rec close outpt f/u.  Return precautions provided.     Final Clinical Impressions(s) / ED Diagnoses   Final diagnoses:  None    ED Discharge Orders    None           Cathren Laine, MD 01/30/18 1407

## 2018-01-31 LAB — GC/CHLAMYDIA PROBE AMP (~~LOC~~) NOT AT ARMC
Chlamydia: NEGATIVE
NEISSERIA GONORRHEA: NEGATIVE

## 2018-03-12 ENCOUNTER — Encounter: Payer: Medicaid Other | Admitting: Advanced Practice Midwife

## 2018-03-30 ENCOUNTER — Other Ambulatory Visit: Payer: Self-pay

## 2018-03-30 ENCOUNTER — Emergency Department (HOSPITAL_COMMUNITY): Payer: Medicaid Other

## 2018-03-30 ENCOUNTER — Emergency Department (HOSPITAL_COMMUNITY)
Admission: EM | Admit: 2018-03-30 | Discharge: 2018-03-30 | Disposition: A | Discharge status: Home / Self Care | Payer: Medicaid Other | Attending: Emergency Medicine | Admitting: Emergency Medicine

## 2018-03-30 ENCOUNTER — Encounter (HOSPITAL_COMMUNITY): Payer: Self-pay

## 2018-03-30 DIAGNOSIS — M25511 Pain in right shoulder: Secondary | ICD-10-CM | POA: Insufficient documentation

## 2018-03-30 DIAGNOSIS — F17290 Nicotine dependence, other tobacco product, uncomplicated: Secondary | ICD-10-CM | POA: Insufficient documentation

## 2018-03-30 DIAGNOSIS — Y929 Unspecified place or not applicable: Secondary | ICD-10-CM | POA: Insufficient documentation

## 2018-03-30 DIAGNOSIS — Y9311 Activity, swimming: Secondary | ICD-10-CM | POA: Insufficient documentation

## 2018-03-30 DIAGNOSIS — Y998 Other external cause status: Secondary | ICD-10-CM | POA: Insufficient documentation

## 2018-03-30 DIAGNOSIS — M25519 Pain in unspecified shoulder: Secondary | ICD-10-CM

## 2018-03-30 DIAGNOSIS — M542 Cervicalgia: Secondary | ICD-10-CM

## 2018-03-30 DIAGNOSIS — S90851A Superficial foreign body, right foot, initial encounter: Secondary | ICD-10-CM | POA: Insufficient documentation

## 2018-03-30 DIAGNOSIS — T148XXA Other injury of unspecified body region, initial encounter: Secondary | ICD-10-CM

## 2018-03-30 DIAGNOSIS — X58XXXA Exposure to other specified factors, initial encounter: Secondary | ICD-10-CM | POA: Insufficient documentation

## 2018-03-30 DIAGNOSIS — Z23 Encounter for immunization: Secondary | ICD-10-CM | POA: Insufficient documentation

## 2018-03-30 MED ORDER — METHOCARBAMOL 500 MG PO TABS: 500.0000 mg | ORAL_TABLET | Freq: Two times a day (BID) | ORAL | 0 refills | Status: DC

## 2018-03-30 MED ORDER — TETANUS-DIPHTH-ACELL PERTUSSIS 5-2.5-18.5 LF-MCG/0.5 IM SUSP
0.5000 mL | Freq: Once | INTRAMUSCULAR | Status: AC
  Administered 2018-03-30: 0.5 mL via INTRAMUSCULAR
  Filled 2018-03-30: qty 0.5

## 2018-03-30 MED ORDER — NAPROXEN 500 MG PO TABS: 500.0000 mg | ORAL_TABLET | Freq: Two times a day (BID) | ORAL | 0 refills | Status: DC

## 2018-03-30 MED ORDER — KETOROLAC TROMETHAMINE 30 MG/ML IJ SOLN
30.0000 mg | Freq: Once | INTRAMUSCULAR | Status: AC
  Administered 2018-03-30: 30 mg via INTRAMUSCULAR
  Filled 2018-03-30: qty 1

## 2018-03-30 MED ORDER — LIDOCAINE-EPINEPHRINE (PF) 2 %-1:200000 IJ SOLN
10.0000 mL | Freq: Once | INTRAMUSCULAR | Status: AC
  Administered 2018-03-30: 10 mL
  Filled 2018-03-30: qty 20

## 2018-03-30 NOTE — ED Provider Notes (Signed)
[facility]-EMERGENCY DEPT Provider Note   CSN: [id number] Arrival date & time: 03/30/18  7829     History   Chief Complaint Chief Complaint  Patient presents with  . Shoulder Pain  . Foot Pain    HPI Tanya Stone is a 42 y.o. female.  Tanya Stone is a 42 y.o. Female who is otherwise healthy, presents to the emergency department for evaluation of pain in her right shoulder for the past 4 to 5 days.  She reports pain is present primarily across the front of the shoulder joint, is a constant ache that is worse with movement especially if she tries to raise her arm.  Patient also reports some pain on the right side of her neck.  She is tried Tylenol with minimal improvement has not tried anything else to treat this pain.  She denies any obvious injury to the area.  Does not have numbness or tingling in the arm and no weakness but occasionally pain shoots down the arm.  No associated chest pain or shortness of breath.  Patient also reports that when she was swimming about 2 weeks ago she stepped on something she reports that she has something stuck in the bottom of her right foot, she was able to get out something small that seems like a splinter but still feels like there is something left in her foot and does not want to get infected, she thinks she may need a tetanus shot.  She has not noticed any redness or swelling around the area no drainage, no difficulty walking on the foot.     History reviewed. No pertinent past medical history.  There are no active problems to display for this patient.   Past Surgical History:  Procedure Laterality Date  . CARPAL TUNNEL RELEASE    . FOOT SURGERY    . TUBAL LIGATION       OB History    Gravida  3   Para  3   Term      Preterm      AB      Living  3     SAB      TAB      Ectopic      Multiple      Live Births  3            Home Medications    Prior to Admission medications     Medication Sig Start Date End Date Taking? Authorizing Provider  acetaminophen (TYLENOL) 325 MG tablet Take 650 mg by mouth every 6 (six) hours as needed for mild pain.   Yes [provider]  baclofen (LIORESAL) 10 MG tablet Take 1 tablet (10 mg total) by mouth 3 (three) times daily. Patient not taking: Reported on 09/14/2017 04/04/17   Arthor Captain, PA-C  cephALEXin (KEFLEX) 500 MG capsule Take 1 capsule (500 mg total) by mouth 4 (four) times daily. Patient not taking: Reported on 09/21/2017 09/14/17   Melene Plan, DO  lidocaine (XYLOCAINE) 2 % solution Use as directed 15 mLs in the mouth or throat as needed for mouth pain. Patient not taking: Reported on 01/30/2018 09/24/17   McDonald, Pedro Earls A, PA-C  meloxicam (MOBIC) 15 MG tablet Take 1 tablet (15 mg total) by mouth daily. Take 1 daily with food. Patient not taking: Reported on 09/14/2017 04/04/17   Arthor Captain, PA-C  methocarbamol (ROBAXIN) 500 MG tablet Take 1 tablet (500 mg total) by mouth 2 (two) times daily.  03/30/18   Dartha Lodge, PA-C  naproxen (NAPROSYN) 500 MG tablet Take 1 tablet (500 mg total) by mouth 2 (two) times daily. 03/30/18   Dartha Lodge, PA-C  ondansetron (ZOFRAN ODT) 4 MG disintegrating tablet Take 1 tablet (4 mg total) by mouth every 8 (eight) hours as needed for nausea or vomiting. Patient not taking: Reported on 01/30/2018 09/21/17   Michela Pitcher A, PA-C  phenazopyridine (PYRIDIUM) 200 MG tablet Take 1 tablet (200 mg total) by mouth 3 (three) times daily as needed for pain. Patient not taking: Reported on 01/30/2018 09/14/17   Melene Plan, DO  promethazine-dextromethorphan (PROMETHAZINE-DM) 6.25-15 MG/5ML syrup Take 5 mLs by mouth 4 (four) times daily as needed for cough. Patient not taking: Reported on 01/30/2018 09/24/17   Barkley Boards, PA-C    Family History Family History  Problem Relation Age of Onset  . Hypertension Mother   . Hypertension Father   . Diabetes Father     Social History Social  History   Tobacco Use  . Smoking status: Current Some Day Smoker    Types: Cigars  . Smokeless tobacco: Never Used  Substance Use Topics  . Alcohol use: Yes    Comment: ocassionally  . Drug use: No     Allergies   Patient has no known allergies.   Review of Systems Review of Systems  Constitutional: Negative for chills and fever.  Respiratory: Negative for shortness of breath.   Cardiovascular: Negative for chest pain.  Musculoskeletal: Positive for arthralgias and neck pain. Negative for joint swelling and neck stiffness.  Skin: Negative for color change and rash.  Neurological: Negative for weakness and numbness.     Physical Exam Updated Vital Signs BP 120/72 (BP Location: Left Arm)   Pulse 76   Temp 98.7 F (37.1 C) (Oral)   Resp 15   Ht 5\' 9"  (1.753 m)   Wt 111.6 kg (246 lb)   LMP 03/07/2018 (Exact Date)   SpO2 100%   BMI 36.33 kg/m   Physical Exam  Constitutional: She appears well-developed and well-nourished. No distress.  HENT:  Head: Normocephalic and atraumatic.  Eyes: Right eye exhibits no discharge. Left eye exhibits no discharge.  Neck: Normal range of motion. Neck supple.  There is some mild tenderness over the right paraspinal muscles, no overlying erythema or skin changes, no palpable deformity  Cardiovascular: Normal rate, regular rhythm, normal heart sounds and intact distal pulses.  Pulmonary/Chest: Effort normal and breath sounds normal. No stridor. No respiratory distress. She has no wheezes. She has no rales.  Respirations equal and unlabored, patient able to speak in full sentences, lungs clear to auscultation bilaterally  Musculoskeletal:  Tenderness to palpation over the anterior right shoulder joint, no overlying erythema, no appreciable swelling or palpable deformity, active and passive range of motion intact with some discomfort especially with reaching upwards, normal range of motion at the elbow and wrist without pain, no pain over  the posterior shoulder scapula, 2+ radial pulse and good capillary refill, sensation intact throughout, arm with some mild hyperesthesia, 5/5 strength with grip, push and pull  Neurological: She is alert. Coordination normal.  Skin: Skin is warm and dry. She is not diaphoretic.  Bottom of the right foot with small area of discoloration under the skin palpable foreign body versus dried blood  Psychiatric: She has a normal mood and affect. Her behavior is normal.  Nursing note and vitals reviewed.    ED Treatments / Results  Labs (all  labs ordered are listed, but only abnormal results are displayed) Labs Reviewed - No data to display  EKG None  Radiology Dg Cervical Spine Complete  Result Date: 03/30/2018 CLINICAL DATA:  Right neck and shoulder pain EXAM: CERVICAL SPINE - COMPLETE 4+ VIEW COMPARISON:  01/05/17 FINDINGS: No evidence of fracture, endplate erosion, or bone lesion. No prevertebral thickening. C5-6 and C6-7 right uncovertebral ridging with mild foraminal encroachment. Apical lungs are clear. IMPRESSION: 1. No acute finding. 2. C5-6 and C6-7 right uncovertebral ridging with mild foraminal encroachment. Electronically Signed   By: Marnee Spring M.D.   On: 03/30/2018 12:38   Dg Shoulder Right  Result Date: 03/30/2018 CLINICAL DATA:  42 year old with right neck and shoulder pain for 5 days. EXAM: RIGHT SHOULDER - 2+ VIEW COMPARISON:  Chest radiograph 09/24/2017 FINDINGS: Sclerosis and spurring along the undersurface of the acromion. Normal alignment at the right [facility] joint. Right shoulder is located without fracture. Mild irregularity of the greater tubercle is suggestive for chronic changes. IMPRESSION: No acute bone abnormality to the right shoulder. Degenerative changes at the right [facility] joint and along the undersurface of the acromion. Electronically Signed   By: Richarda Overlie M.D.   On: 03/30/2018 12:39    Procedures .Foreign Body Removal Date/Time: 03/31/2018 10:20 AM Performed by:  Dartha Lodge, PA-C Authorized by: Dartha Lodge, PA-C  Consent: Verbal consent obtained. Risks and benefits: risks, benefits and alternatives were discussed Consent given by: patient Patient understanding: patient states understanding of the procedure being performed Patient identity confirmed: verbally with patient Body area: skin General location: lower extremity Location details: right foot Anesthesia: local infiltration  Anesthesia: Local Anesthetic: lidocaine 2% with epinephrine  Sedation: Patient sedated: no  Patient cooperative: yes Localization method: visualized and probed Removal mechanism: forceps, irrigation and scalpel Dressing: antibiotic ointment and dressing applied Tendon involvement: none Depth: subcutaneous Complexity: simple 1 objects recovered. Objects recovered: organic material, likely small slpinter fragment Post-procedure assessment: foreign body removed Patient tolerance: Patient tolerated the procedure well with no immediate complications   (including critical care time)  Medications Ordered in ED Medications  Tdap (BOOSTRIX) injection 0.5 mL (0.5 mLs Intramuscular Given 03/30/18 1125)  lidocaine-EPINEPHrine (XYLOCAINE W/EPI) 2 %-1:200000 (PF) injection 10 mL (10 mLs Infiltration Given 03/30/18 1350)  ketorolac (TORADOL) 30 MG/ML injection 30 mg (30 mg Intramuscular Given 03/30/18 1350)     Initial Impression / Assessment and Plan / ED Course  I have reviewed the triage vital signs and the nursing notes.  Pertinent labs & imaging results that were available during my care of the patient were reviewed by me and considered in my medical decision making (see chart for details).  Patient presents for evaluation of 4 to 5 days of pain over the right shoulder and right side of the neck that intermittently radiates down the arm.  Range of motion intact with some discomfort, right arm is neurovascularly intact.  Suspect that this is muscle spasm versus  radicular pain.  X-rays of the neck and shoulder do show some degenerative changes that could explain pain.  Will treat with NSAIDs and muscle relaxers.  She is to follow-up with her primary doctor if pain not improving.  Patient also had small organic foreign body in the sole of the right foot, tetanus updated, area locally anesthetized, small cut made and splinterlike organic material as well as surrounding dried blood removed and irrigated.  Dressed with antibiotic ointment and bandage, encouraged warm soaks with Epson salts and to keep dry  and covered, discussed signs of infection that should warrant sooner return.  At this time patient is stable for discharge home.  She is develop with primary care, return precautions discussed.  Patient expresses understanding and is in agreement with plan.  Final Clinical Impressions(s) / ED Diagnoses   Final diagnoses:  Shoulder pain  Neck pain  Foreign body in skin    ED Discharge Orders        Ordered    naproxen (NAPROSYN) 500 MG tablet  2 times daily     03/30/18 1344    methocarbamol (ROBAXIN) 500 MG tablet  2 times daily     03/30/18 1344       Dartha LodgeFord, Tionne Dayhoff N, [place]PA-C 03/31/18 1026    Shaune PollackIsaacs, [place], MD 04/01/18 1538

## 2018-03-30 NOTE — ED Triage Notes (Signed)
Patient c/o right shoulder pain x 4-5 days. Patient denies any injuries. Patient states when she tries to raise her right arm she is having pain.  patient also reports that she went swimming 2 weeks ago and stepped on something. Patient stated she tried to get it out,but can still feel something in her right foot

## 2018-03-30 NOTE — Discharge Instructions (Signed)
X-rays of your neck and shoulder do show some degenerative changes that could be causing the pain I also think you could have a pinched nerve, also known as a radiculopathy, causing the symptoms, please use Naprosyn twice daily and Robaxin in the evenings as we discussed, this medication can cause some drowsiness do not take before driving and do not combine with alcohol.  You can also use over-the-counter muscle rub such as Biofreeze or salon pas patches.  If symptoms are not improving please follow-up with your regular doctor.  Small splinter removed from the bottom of your foot, keep this area clean and dry with antibiotic ointment and a bandage, you can soak it couple times a day in warm water and Epsom salts to help promote healing and remove any tiny fragments of splinter left over.  Monitor for redness, swelling, increasing pain or drainage as these are signs of infection.

## 2018-03-30 NOTE — ED Notes (Signed)
Patient verbalized understanding of discharge instructions, no questions. Patient ambulated out of ED with steady gait in no distress.  

## 2018-05-10 ENCOUNTER — Encounter (HOSPITAL_COMMUNITY): Payer: Self-pay | Admitting: Family Medicine

## 2018-05-10 ENCOUNTER — Emergency Department (HOSPITAL_COMMUNITY): Payer: Self-pay

## 2018-05-10 ENCOUNTER — Emergency Department (HOSPITAL_COMMUNITY)
Admission: EM | Admit: 2018-05-10 | Discharge: 2018-05-10 | Disposition: A | Discharge status: Home / Self Care | Payer: Self-pay | Attending: Emergency Medicine | Admitting: Emergency Medicine

## 2018-05-10 DIAGNOSIS — F17290 Nicotine dependence, other tobacco product, uncomplicated: Secondary | ICD-10-CM | POA: Insufficient documentation

## 2018-05-10 DIAGNOSIS — R109 Unspecified abdominal pain: Secondary | ICD-10-CM

## 2018-05-10 DIAGNOSIS — R1032 Left lower quadrant pain: Secondary | ICD-10-CM | POA: Insufficient documentation

## 2018-05-10 LAB — URINALYSIS, ROUTINE W REFLEX MICROSCOPIC
Bacteria, UA: NONE SEEN
Bilirubin Urine: NEGATIVE
GLUCOSE, UA: NEGATIVE mg/dL
HGB URINE DIPSTICK: NEGATIVE
Ketones, ur: NEGATIVE mg/dL
Leukocytes, UA: NEGATIVE
NITRITE: NEGATIVE
PH: 6 (ref 5.0–8.0)
Protein, ur: NEGATIVE mg/dL
SPECIFIC GRAVITY, URINE: 1.012 (ref 1.005–1.030)

## 2018-05-10 LAB — POC URINE PREG, ED: Preg Test, Ur: NEGATIVE

## 2018-05-10 MED ORDER — IBUPROFEN 600 MG PO TABS: 600.0000 mg | ORAL_TABLET | Freq: Four times a day (QID) | ORAL | 0 refills | Status: DC | PRN

## 2018-05-10 MED ORDER — HYDROCODONE-ACETAMINOPHEN 5-325 MG PO TABS
1.0000 | ORAL_TABLET | Freq: Once | ORAL | Status: AC
  Administered 2018-05-10: 1 via ORAL
  Filled 2018-05-10: qty 1

## 2018-05-10 MED ORDER — CYCLOBENZAPRINE HCL 10 MG PO TABS: 10.0000 mg | ORAL_TABLET | Freq: Two times a day (BID) | ORAL | 0 refills | Status: DC | PRN

## 2018-05-10 NOTE — ED Notes (Signed)
ED Provider at bedside. 

## 2018-05-10 NOTE — ED Provider Notes (Signed)
[facility]-EMERGENCY DEPT Provider Note   CSN: [id number] Arrival date & time: 05/10/18  0003     History   Chief Complaint Chief Complaint  Patient presents with  . Flank Pain    HPI Tanya Stone is a 42 y.o. female.  Patient presents for evaluation of left flank pain for the past one week. No known injury. She reports the pain is constant, non-radiating. She denies fever, nausea, vomiting, dysuria, hematuria or vaginal symptoms. She feels like if she stretches or changes position it will make the pain better. No history of similar symptoms. No known history of kidney stone.    The history is provided by the patient. No language interpreter was used.  Flank Pain  Pertinent negatives include no abdominal pain and no shortness of breath.    History reviewed. No pertinent past medical history.  There are no active problems to display for this patient.   Past Surgical History:  Procedure Laterality Date  . CARPAL TUNNEL RELEASE    . FOOT SURGERY    . TUBAL LIGATION       OB History    Gravida  3   Para  3   Term      Preterm      AB      Living  3     SAB      TAB      Ectopic      Multiple      Live Births  3            Home Medications    Prior to Admission medications   Medication Sig Start Date End Date Taking? Authorizing Provider  acetaminophen (TYLENOL) 500 MG tablet Take 1,000 mg by mouth every 6 (six) hours as needed for moderate pain.   Yes [provider]  baclofen (LIORESAL) 10 MG tablet Take 1 tablet (10 mg total) by mouth 3 (three) times daily. Patient not taking: Reported on 09/14/2017 04/04/17   Arthor Captain, PA-C  cephALEXin (KEFLEX) 500 MG capsule Take 1 capsule (500 mg total) by mouth 4 (four) times daily. Patient not taking: Reported on 09/21/2017 09/14/17   Melene Plan, DO  lidocaine (XYLOCAINE) 2 % solution Use as directed 15 mLs in the mouth or throat as needed for mouth pain. Patient  not taking: Reported on 01/30/2018 09/24/17   McDonald, Pedro Earls A, PA-C  meloxicam (MOBIC) 15 MG tablet Take 1 tablet (15 mg total) by mouth daily. Take 1 daily with food. Patient not taking: Reported on 09/14/2017 04/04/17   Arthor Captain, PA-C  methocarbamol (ROBAXIN) 500 MG tablet Take 1 tablet (500 mg total) by mouth 2 (two) times daily. Patient not taking: Reported on 05/10/2018 03/30/18   Dartha Lodge, PA-C  naproxen (NAPROSYN) 500 MG tablet Take 1 tablet (500 mg total) by mouth 2 (two) times daily. Patient not taking: Reported on 05/10/2018 03/30/18   Dartha Lodge, PA-C  ondansetron (ZOFRAN ODT) 4 MG disintegrating tablet Take 1 tablet (4 mg total) by mouth every 8 (eight) hours as needed for nausea or vomiting. Patient not taking: Reported on 01/30/2018 09/21/17   Michela Pitcher A, PA-C  phenazopyridine (PYRIDIUM) 200 MG tablet Take 1 tablet (200 mg total) by mouth 3 (three) times daily as needed for pain. Patient not taking: Reported on 01/30/2018 09/14/17   Melene Plan, DO  promethazine-dextromethorphan (PROMETHAZINE-DM) 6.25-15 MG/5ML syrup Take 5 mLs by mouth 4 (four) times daily as needed for cough. Patient not  taking: Reported on 01/30/2018 09/24/17   Barkley Boards, PA-C    Family History Family History  Problem Relation Age of Onset  . Hypertension Mother   . Hypertension Father   . Diabetes Father     Social History Social History   Tobacco Use  . Smoking status: Current Some Day Smoker    Types: Cigars  . Smokeless tobacco: Never Used  Substance Use Topics  . Alcohol use: Yes    Comment: Once a month   . Drug use: No     Allergies   Patient has no known allergies.   Review of Systems Review of Systems  Constitutional: Negative for chills and fever.  Respiratory: Negative.  Negative for cough and shortness of breath.   Cardiovascular: Negative.   Gastrointestinal: Negative.  Negative for abdominal pain, nausea and vomiting.  Genitourinary: Positive for flank pain.  Negative for dysuria, hematuria, vaginal bleeding and vaginal discharge.  Skin: Negative.   Neurological: Negative.  Negative for weakness and light-headedness.     Physical Exam Updated Vital Signs BP 133/80 (BP Location: Left Arm)   Pulse 67   Temp 97.7 F (36.5 C) (Oral)   Resp 18   Ht 6' (1.829 m)   Wt 111.6 kg   LMP 04/25/2018   SpO2 100%   BMI 33.36 kg/m   Physical Exam  Constitutional: She is oriented to person, place, and time. She appears well-developed and well-nourished.  HENT:  Head: Normocephalic.  Neck: Normal range of motion. Neck supple.  Cardiovascular: Normal rate and regular rhythm.  Pulmonary/Chest: Effort normal and breath sounds normal. She has no wheezes. She has no rales.  Abdominal: Soft. Bowel sounds are normal. There is no tenderness. There is no rebound and no guarding.  Genitourinary:  Genitourinary Comments: Left CVA tenderness  Musculoskeletal: Normal range of motion.  Neurological: She is alert and oriented to person, place, and time.  Skin: Skin is warm and dry. No rash noted.  Psychiatric: She has a normal mood and affect.     ED Treatments / Results  Labs (all labs ordered are listed, but only abnormal results are displayed) Labs Reviewed  URINALYSIS, ROUTINE W REFLEX MICROSCOPIC - Abnormal; Notable for the following components:      Result Value   Color, Urine STRAW (*)    All other components within normal limits  POC URINE PREG, ED   Results for orders placed or performed during the hospital encounter of 05/10/18  Urinalysis, Routine w reflex microscopic- may I&O cath if menses  Result Value Ref Range   Color, Urine STRAW (A) YELLOW   APPearance CLEAR CLEAR   Specific Gravity, Urine 1.012 1.005 - 1.030   pH 6.0 5.0 - 8.0   Glucose, UA NEGATIVE NEGATIVE mg/dL   Hgb urine dipstick NEGATIVE NEGATIVE   Bilirubin Urine NEGATIVE NEGATIVE   Ketones, ur NEGATIVE NEGATIVE mg/dL   Protein, ur NEGATIVE NEGATIVE mg/dL   Nitrite  NEGATIVE NEGATIVE   Leukocytes, UA NEGATIVE NEGATIVE   RBC / HPF 0-5 0 - 5 RBC/hpf   WBC, UA 0-5 0 - 5 WBC/hpf   Bacteria, UA NONE SEEN NONE SEEN   Squamous Epithelial / LPF 0-5 0 - 5   Mucus PRESENT   POC urine preg, ED  Result Value Ref Range   Preg Test, Ur NEGATIVE NEGATIVE    EKG None  Radiology No results found.  Procedures Procedures (including critical care time)  Medications Ordered in ED Medications - No data to  display   Initial Impression / Assessment and Plan / ED Course  I have reviewed the triage vital signs and the nursing notes.  Pertinent labs & imaging results that were available during my care of the patient were reviewed by me and considered in my medical decision making (see chart for details).     Patient presents with c/o left flank pain x 1 week.   Urine negative for infection or blood. VSS. She reports she is improved with Norco given here. She has scheduled outpatient follow up with PCP on the 28th.   There is no evidence of infection or kidney stone. Doubt infarction with pain x 1 week and normal blood studies. Pain has been tolerable but persistent prompting ED evaluation.   She can be discharged home. She is encouraged to see her doctor as scheduled. Ibuprofen and Flexeril provided for likely musculoskeletal pain.  Final Clinical Impressions(s) / ED Diagnoses   Final diagnoses:  None   1. Left flank pain  ED Discharge Orders    None       Elpidio AnisUpstill, Brittie Whisnant, PA-C 05/10/18 Harvie [id number]    Devoria AlbeKnapp, Iva, MD 05/10/18 [phone]

## 2018-05-10 NOTE — ED Triage Notes (Signed)
Patient reports she is experiencing left flank pain that started about a week ago. Associated symptoms are diarrhea, cloudy urine, and increased urgency. Denies any fever. Took TYLENOL 2 days ago.

## 2018-05-10 NOTE — Discharge Instructions (Addendum)
Take medications as prescribed. Follow up with your doctor for recheck this week. Return to the emergency department with any high fever, severe pain or new concerning symptom.

## 2018-05-29 ENCOUNTER — Ambulatory Visit: Payer: Medicaid Other | Admitting: Obstetrics & Gynecology

## 2018-06-06 ENCOUNTER — Ambulatory Visit: Payer: Medicaid Other | Admitting: Obstetrics & Gynecology

## 2018-07-10 ENCOUNTER — Ambulatory Visit (INDEPENDENT_AMBULATORY_CARE_PROVIDER_SITE_OTHER): Payer: Medicaid Other | Admitting: Obstetrics

## 2018-07-10 ENCOUNTER — Encounter: Payer: Self-pay | Admitting: Obstetrics

## 2018-07-10 ENCOUNTER — Other Ambulatory Visit (HOSPITAL_COMMUNITY)
Admission: RE | Admit: 2018-07-10 | Discharge: 2018-07-10 | Disposition: A | Discharge status: Home / Self Care | Payer: Medicaid Other | Source: Ambulatory Visit | Attending: Obstetrics | Admitting: Obstetrics

## 2018-07-10 VITALS — BP 133/84 | HR 80 | Ht 69.0 in | Wt 235.8 lb

## 2018-07-10 DIAGNOSIS — N898 Other specified noninflammatory disorders of vagina: Secondary | ICD-10-CM

## 2018-07-10 DIAGNOSIS — Z01419 Encounter for gynecological examination (general) (routine) without abnormal findings: Secondary | ICD-10-CM | POA: Diagnosis present

## 2018-07-10 DIAGNOSIS — Z0000 Encounter for general adult medical examination without abnormal findings: Secondary | ICD-10-CM

## 2018-07-10 DIAGNOSIS — M545 Low back pain, unspecified: Secondary | ICD-10-CM

## 2018-07-10 MED ORDER — KETOROLAC TROMETHAMINE 60 MG/2ML IM SOLN
60.0000 mg | Freq: Once | INTRAMUSCULAR | Status: AC
  Administered 2018-07-10: 60 mg via INTRAMUSCULAR

## 2018-07-10 MED ORDER — CYCLOBENZAPRINE HCL 10 MG PO TABS: 10.0000 mg | ORAL_TABLET | Freq: Two times a day (BID) | ORAL | 1 refills | Status: DC

## 2018-07-10 MED ORDER — METHOCARBAMOL 500 MG PO TABS: 500.0000 mg | ORAL_TABLET | Freq: Two times a day (BID) | ORAL | 2 refills | Status: DC

## 2018-07-10 NOTE — Progress Notes (Signed)
Patient is in the office for annual, last pap over a year ago. Pt complains of lower back and pelvic pain that started 2 weeks ago. Pt desires std testing today.  Administered toradol and pt tolerated well .Marland Kitchen Administrations This Visit    ketorolac (TORADOL) injection 60 mg    Admin Date 07/10/2018 Action Given Dose 60 mg Route Intramuscular Administered By Katrina Stack, RN

## 2018-07-10 NOTE — Progress Notes (Signed)
Subjective:        Tanya Stone is a 42 y.o. female here for a routine exam.  Current complaints: Backache that started 2 weeks ago.  Denies dysuria, pelvic pain or change in periods.  She states that her periods have been worse since her tubal sterilization.  Personal health questionnaire:  Is patient Tanya Stone Jewish, have a family history of breast and/or ovarian cancer: no Is there a family history of uterine cancer diagnosed at age < 54, gastrointestinal cancer, urinary tract cancer, family member who is a [name]: no Is the patient overweight and hypertensive, family history of diabetes, personal history of gestational diabetes, preeclampsia or PCOS: no Is patient over 36, have PCOS,  family history of premature CHD under age 67, diabetes, smoke, have hypertension or peripheral artery disease:  no At any time, has a partner hit, kicked or otherwise hurt or frightened you?: no Over the past 2 weeks, have you felt down, depressed or hopeless?: no Over the past 2 weeks, have you felt little interest or pleasure in doing things?:no   Gynecologic History Patient's last menstrual period was 06/17/2018. Contraception: tubal ligation Last Pap: unknown. Results were: unknown Last mammogram: n/a. Results were: n/a  Obstetric History OB History  Gravida Para Term Preterm AB Living  3 3       3   SAB TAB Ectopic Multiple Live Births          3    # Outcome Date GA Lbr Len/2nd Weight Sex Delivery Anes PTL Lv  3 Para 06/18/05     Vag-Spont   LIV  2 Para 01/19/99     Vag-Spont   LIV  1 Para 11/10/94     Vag-Spont   LIV    History reviewed. No pertinent past medical history.  Past Surgical History:  Procedure Laterality Date  . CARPAL TUNNEL RELEASE    . FOOT SURGERY    . TUBAL LIGATION       Current Outpatient Medications:  .  acetaminophen (TYLENOL) 500 MG tablet, Take 1,000 mg by mouth every 6 (six) hours as needed for moderate pain., Disp: ,  Rfl:  .  baclofen (LIORESAL) 10 MG tablet, Take 1 tablet (10 mg total) by mouth 3 (three) times daily. (Patient not taking: Reported on 09/14/2017), Disp: 30 each, Rfl: 0 .  cephALEXin (KEFLEX) 500 MG capsule, Take 1 capsule (500 mg total) by mouth 4 (four) times daily. (Patient not taking: Reported on 09/21/2017), Disp: 20 capsule, Rfl: 0 .  cyclobenzaprine (FLEXERIL) 10 MG tablet, Take 1 tablet (10 mg total) by mouth 2 (two) times daily as needed for muscle spasms. (Patient not taking: Reported on 07/10/2018), Disp: 20 tablet, Rfl: 0 .  cyclobenzaprine (FLEXERIL) 10 MG tablet, Take 1 tablet (10 mg total) by mouth 2 (two) times daily., Disp: 30 tablet, Rfl: 1 .  ibuprofen (ADVIL,MOTRIN) 600 MG tablet, Take 1 tablet (600 mg total) by mouth every 6 (six) hours as needed. (Patient not taking: Reported on 07/10/2018), Disp: 30 tablet, Rfl: 0 .  lidocaine (XYLOCAINE) 2 % solution, Use as directed 15 mLs in the mouth or throat as needed for mouth pain. (Patient not taking: Reported on 01/30/2018), Disp: 100 mL, Rfl: 0 .  meloxicam (MOBIC) 15 MG tablet, Take 1 tablet (15 mg total) by mouth daily. Take 1 daily with food. (Patient not taking: Reported on 09/14/2017), Disp: 10 tablet, Rfl: 0 .  methocarbamol (ROBAXIN) 500 MG tablet, Take 1 tablet (500 mg  total) by mouth 2 (two) times daily., Disp: 60 tablet, Rfl: 2 .  naproxen (NAPROSYN) 500 MG tablet, Take 1 tablet (500 mg total) by mouth 2 (two) times daily. (Patient not taking: Reported on 05/10/2018), Disp: 30 tablet, Rfl: 0 .  ondansetron (ZOFRAN ODT) 4 MG disintegrating tablet, Take 1 tablet (4 mg total) by mouth every 8 (eight) hours as needed for nausea or vomiting. (Patient not taking: Reported on 01/30/2018), Disp: 10 tablet, Rfl: 0 .  phenazopyridine (PYRIDIUM) 200 MG tablet, Take 1 tablet (200 mg total) by mouth 3 (three) times daily as needed for pain. (Patient not taking: Reported on 01/30/2018), Disp: 6 tablet, Rfl: 0 .  promethazine-dextromethorphan  (PROMETHAZINE-DM) 6.25-15 MG/5ML syrup, Take 5 mLs by mouth 4 (four) times daily as needed for cough. (Patient not taking: Reported on 01/30/2018), Disp: 118 mL, Rfl: 0 No Known Allergies  Social History   Tobacco Use  . Smoking status: Former Smoker    Types: Cigars  . Smokeless tobacco: Never Used  Substance Use Topics  . Alcohol use: Yes    Comment: Once a month     Family History  Problem Relation Age of Onset  . Hypertension Mother   . Hypertension Father   . Diabetes Father       Review of Systems  Constitutional: negative for fatigue and weight loss Respiratory: negative for cough and wheezing Cardiovascular: negative for chest pain, fatigue and palpitations Gastrointestinal: negative for abdominal pain and change in bowel habits Musculoskeletal:positive for myalgias-backache Neurological: negative for gait problems and tremors Behavioral/Psych: negative for abusive relationship, depression Endocrine: negative for temperature intolerance    Genitourinary:positive for painful periods Integument/breast: negative for breast lump, breast tenderness, nipple discharge and skin lesion(s)    Objective:       BP 133/84   Pulse 80   Ht 5\' 9"  (1.753 m)   Wt 235 lb 12.8 oz (107 kg)   LMP 06/17/2018   BMI 34.82 kg/m  General:   alert  Skin:   no rash or abnormalities  Lungs:   clear to auscultation bilaterally  Heart:   regular rate and rhythm, S1, S2 normal, no murmur, click, rub or gallop  Breasts:   normal without suspicious masses, skin or nipple changes or axillary nodes  Abdomen:  normal findings: no organomegaly, soft, non-tender and no hernia  Pelvis:  External genitalia: normal general appearance Urinary system: urethral meatus normal and bladder without fullness, nontender Vaginal: normal without tenderness, induration or masses Cervix: normal appearance Adnexa: normal bimanual exam Uterus: anteverted and non-tender, normal size   Lab Review Urine  pregnancy test Labs reviewed yes Radiologic studies reviewed no  50% of 20 min visit spent on counseling and coordination of care.   Assessment:     1. Encounter for routine gynecological examination with Papanicolaou smear of cervix Rx: - Cytology - PAP  2. Vaginal discharge Rx: - Cervicovaginal ancillary only  3. Acute midline low back pain without sciatica Rx: - methocarbamol (ROBAXIN) 500 MG tablet; Take 1 tablet (500 mg total) by mouth 2 (two) times daily.  Dispense: 60 tablet; Refill: 2 - cyclobenzaprine (FLEXERIL) 10 MG tablet; Take 1 tablet (10 mg total) by mouth 2 (two) times daily.  Dispense: 30 tablet; Refill: 1    Plan:    Education reviewed: calcium supplements, depression evaluation, low fat, low cholesterol diet, safe sex/STD prevention, self breast exams and weight bearing exercise. Follow up in: 1 year.   Meds ordered this encounter  Medications  .  methocarbamol (ROBAXIN) 500 MG tablet    Sig: Take 1 tablet (500 mg total) by mouth 2 (two) times daily.    Dispense:  60 tablet    Refill:  2  . cyclobenzaprine (FLEXERIL) 10 MG tablet    Sig: Take 1 tablet (10 mg total) by mouth 2 (two) times daily.    Dispense:  30 tablet    Refill:  1  . ketorolac (TORADOL) injection 60 mg   No orders of the defined types were placed in this encounter.   Brock Bad MD 07-10-2018

## 2018-07-11 LAB — CERVICOVAGINAL ANCILLARY ONLY
CHLAMYDIA, DNA PROBE: NEGATIVE
Neisseria Gonorrhea: NEGATIVE

## 2018-07-12 LAB — CYTOLOGY - PAP
Diagnosis: HIGH — AB
HPV: NOT DETECTED

## 2018-07-16 ENCOUNTER — Ambulatory Visit (INDEPENDENT_AMBULATORY_CARE_PROVIDER_SITE_OTHER): Payer: Medicaid Other | Admitting: Obstetrics

## 2018-07-16 ENCOUNTER — Encounter: Payer: Self-pay | Admitting: Obstetrics

## 2018-07-16 DIAGNOSIS — R87613 High grade squamous intraepithelial lesion on cytologic smear of cervix (HGSIL): Secondary | ICD-10-CM

## 2018-07-16 NOTE — Progress Notes (Signed)
Patient ID: Tanya Stone, female   DOB: 21-Sep-1976, 42 y.o.   MRN: [id number]  Chief Complaint  Patient presents with  . Consult    HPI Tanya Stone is a 42 y.o. female.  Abnormal pap smear.  Presents for results and management recommendations. HPI  History reviewed. No pertinent past medical history.  Past Surgical History:  Procedure Laterality Date  . CARPAL TUNNEL RELEASE    . FOOT SURGERY    . TUBAL LIGATION      Family History  Problem Relation Age of Onset  . Hypertension Mother   . Hypertension Father   . Diabetes Father     Social History Social History   Tobacco Use  . Smoking status: Former Smoker    Types: Cigars  . Smokeless tobacco: Never Used  Substance Use Topics  . Alcohol use: Yes    Comment: Once a month   . Drug use: No    No Known Allergies  Current Outpatient Medications  Medication Sig Dispense Refill  . acetaminophen (TYLENOL) 500 MG tablet Take 1,000 mg by mouth every 6 (six) hours as needed for moderate pain.    . baclofen (LIORESAL) 10 MG tablet Take 1 tablet (10 mg total) by mouth 3 (three) times daily. (Patient not taking: Reported on 09/14/2017) 30 each 0  . cephALEXin (KEFLEX) 500 MG capsule Take 1 capsule (500 mg total) by mouth 4 (four) times daily. (Patient not taking: Reported on 09/21/2017) 20 capsule 0  . cyclobenzaprine (FLEXERIL) 10 MG tablet Take 1 tablet (10 mg total) by mouth 2 (two) times daily as needed for muscle spasms. (Patient not taking: Reported on 07/10/2018) 20 tablet 0  . cyclobenzaprine (FLEXERIL) 10 MG tablet Take 1 tablet (10 mg total) by mouth 2 (two) times daily. 30 tablet 1  . ibuprofen (ADVIL,MOTRIN) 600 MG tablet Take 1 tablet (600 mg total) by mouth every 6 (six) hours as needed. (Patient not taking: Reported on 07/10/2018) 30 tablet 0  . lidocaine (XYLOCAINE) 2 % solution Use as directed 15 mLs in the mouth or throat as needed for mouth pain. (Patient not taking: Reported on 01/30/2018) 100 mL 0  .  meloxicam (MOBIC) 15 MG tablet Take 1 tablet (15 mg total) by mouth daily. Take 1 daily with food. (Patient not taking: Reported on 09/14/2017) 10 tablet 0  . methocarbamol (ROBAXIN) 500 MG tablet Take 1 tablet (500 mg total) by mouth 2 (two) times daily. 60 tablet 2  . naproxen (NAPROSYN) 500 MG tablet Take 1 tablet (500 mg total) by mouth 2 (two) times daily. (Patient not taking: Reported on 05/10/2018) 30 tablet 0  . ondansetron (ZOFRAN ODT) 4 MG disintegrating tablet Take 1 tablet (4 mg total) by mouth every 8 (eight) hours as needed for nausea or vomiting. (Patient not taking: Reported on 01/30/2018) 10 tablet 0  . phenazopyridine (PYRIDIUM) 200 MG tablet Take 1 tablet (200 mg total) by mouth 3 (three) times daily as needed for pain. (Patient not taking: Reported on 01/30/2018) 6 tablet 0  . promethazine-dextromethorphan (PROMETHAZINE-DM) 6.25-15 MG/5ML syrup Take 5 mLs by mouth 4 (four) times daily as needed for cough. (Patient not taking: Reported on 01/30/2018) 118 mL 0   No current facility-administered medications for this visit.     Review of Systems Review of Systems Constitutional: negative for fatigue and weight loss Respiratory: negative for cough and wheezing Cardiovascular: negative for chest pain, fatigue and palpitations Gastrointestinal: negative for abdominal pain and change in bowel habits Genitourinary:negative  Integument/breast: negative for nipple discharge Musculoskeletal:negative for myalgias Neurological: negative for gait problems and tremors Behavioral/Psych: negative for abusive relationship, depression Endocrine: negative for temperature intolerance      Last menstrual period 06/17/2018.  Physical Exam Physical Exam:  Deferred >50% of 10 min visit spent on counseling and coordination of care.   Data Reviewed Pap Smear:  Material Submitted  Cytology - PAP  Collected: 07/10/18 0000  Resulting lab: [facility]  Value: CervicoVaginal Pap [ThinPrep  Imaged]Abnormal    *Additional information available - result note  Results   Cytology - PAP (Order [id number])  Result Information   Abnormality Status Priority Source  Abnormal Edited Result - FINAL (07/12/2018 0000) Today Cervix   Yes (MyChart) Next appt:  None Dx:  Encounter for routine gynecological e...  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 HGSIL (high grade squamous intraepithelial lesion) on Pap smear of cervix - diagnosis explained and all questions answered - educational publication from [employer] dispensed on abnormal pap smears, diagnosis and treatment     Plan    Follow up in ~ 2 weeks for colposcopy  No orders of the defined types were placed in this encounter.  No orders of the defined types were placed in this encounter.   Brock Bad MD 07-16-2018

## 2018-07-16 NOTE — Progress Notes (Signed)
Presents for PAP results.

## 2018-07-20 ENCOUNTER — Telehealth (HOSPITAL_COMMUNITY): Payer: Self-pay | Admitting: *Deleted

## 2018-07-20 NOTE — Telephone Encounter (Signed)
Attempted to call patient to schedule appointment with [facility]. No one answered phone. Left voicemail for patient to call me back. 

## 2018-08-09 ENCOUNTER — Ambulatory Visit (HOSPITAL_COMMUNITY): Payer: Medicaid Other

## 2018-08-13 ENCOUNTER — Encounter: Payer: Self-pay | Admitting: Obstetrics

## 2018-08-22 ENCOUNTER — Telehealth (HOSPITAL_COMMUNITY): Payer: Self-pay | Admitting: *Deleted

## 2018-08-22 NOTE — Telephone Encounter (Signed)
Attempted to call patient to reschedule [facility] appointment. No one answered phone. Left voicemail for patient to call me back. 

## 2018-08-23 ENCOUNTER — Telehealth (HOSPITAL_COMMUNITY): Payer: Self-pay | Admitting: *Deleted

## 2018-08-23 NOTE — Telephone Encounter (Signed)
Attempted to call patient to reschedule [facility] appointment. No one answered phone. Left voicemail for patient to call me back. 

## 2018-08-24 ENCOUNTER — Telehealth (HOSPITAL_COMMUNITY): Payer: Self-pay | Admitting: *Deleted

## 2018-08-24 NOTE — Telephone Encounter (Signed)
Patient returned my phone call this morning. Rescheduled patient for [facility] on September 11, 2018 at 1315. Told patient that if she is late for her appointment that she may not be seen that day. Gave patient directions of where to come for appointment. Told patient we will reschedule her colposcopy with [facility]. Patient verbalized understanding.  Called [facility] and patient's colposcopy is rescheduled for September 18, 2018 at 1500.

## 2018-09-11 ENCOUNTER — Other Ambulatory Visit (HOSPITAL_COMMUNITY): Payer: Self-pay | Admitting: *Deleted

## 2018-09-11 ENCOUNTER — Ambulatory Visit (HOSPITAL_COMMUNITY)
Admission: RE | Admit: 2018-09-11 | Discharge: 2018-09-11 | Disposition: A | Discharge status: Home / Self Care | Payer: Medicaid Other | Source: Ambulatory Visit | Attending: Obstetrics and Gynecology | Admitting: Obstetrics and Gynecology

## 2018-09-11 ENCOUNTER — Encounter (HOSPITAL_COMMUNITY): Payer: Self-pay

## 2018-09-11 ENCOUNTER — Encounter (HOSPITAL_COMMUNITY): Payer: Self-pay | Admitting: *Deleted

## 2018-09-11 VITALS — BP 128/74 | Ht 69.0 in | Wt 242.0 lb

## 2018-09-11 DIAGNOSIS — Z1231 Encounter for screening mammogram for malignant neoplasm of breast: Secondary | ICD-10-CM

## 2018-09-11 DIAGNOSIS — Z1239 Encounter for other screening for malignant neoplasm of breast: Secondary | ICD-10-CM

## 2018-09-11 DIAGNOSIS — R87613 High grade squamous intraepithelial lesion on cytologic smear of cervix (HGSIL): Secondary | ICD-10-CM

## 2018-09-11 NOTE — Progress Notes (Signed)
[facility] to [facility] due to having an abnormal Pap smear on 07/10/2018 that a colposcopy is recommended for follow-up.  Pap Smear: Pap smear not completed today. [facility] and HGSIL with negative HPV. [facility] for a colposcopy to follow-up for her abnormal Pap smear. Appointment scheduled for Tuesday, September 18, 2018 at 1500. Per patient has a history of an abnormal Pap smear 11 years ago that a colposcopy was completed for follow-up. Last Pap smear result is in Epic.  Physical exam: Breasts Breasts symmetrical. No skin abnormalities bilateral breasts. No nipple retraction bilateral breasts. No nipple discharge bilateral breasts. No lymphadenopathy. No lumps palpated bilateral breasts. No complaints of pain or tenderness on exam. [facility] mamm[facility]. Appointment scheduled for Wednesday, September 12, 2018 at 0940.        Pelvic/Bimanual No Pap smear completed today since last Pap smear and HPV typing was 07/10/2018. Pap smear not indicated per [facility] guidelines.   Smoking History: Patient is a former smoker that quit 96-months ago.  Patient Navigation: Patient education provided. Access to services provided for patient through [facility] program.   Breast and Cervical Cancer Risk Assessment: Patient has no family history of breast cancer, known genetic mutations, or radiation treatment to the chest before age 40. Per patient has a history of cervical dysplasia. Patient has no history of being immunocompromised or DES exposure in-utero.  Risk Assessment    Risk Scores      09/11/2018   Last edited by: Priscille HeidelbergBrannock, Gema Ringold P, RN   5-year risk: 0.6 %   Lifetime risk: 9.6 %

## 2018-09-11 NOTE — Patient Instructions (Signed)
Explained breast self awareness with Tanya Gardeneratrina D Stone. Patient did not need a Pap smear today due to last Pap smear was 07/10/2018. Explained the colposcopy the recommended follow-up for her abnormal Pap smear. [facility] for a colposcopy to follow-up for her abnormal Pap smear. Appointment scheduled for Tuesday, September 18, 2018 at 1500. [facility] a screening mamm[facility]. Appointment scheduled for Wednesday, September 12, 2018 at 0940. Patient aware of appointments and will be there. Let patient know the Breast Center will follow up with her within the next couple weeks with results of mammogram by letter or phone. Tanya Stone verbalized understanding.  [name], Kathaleen Maserhristine Poll, RN 1:16 PM

## 2018-09-12 ENCOUNTER — Ambulatory Visit
Admission: RE | Admit: 2018-09-12 | Discharge: 2018-09-12 | Disposition: A | Discharge status: Home / Self Care | Payer: Self-pay | Source: Ambulatory Visit | Attending: Obstetrics and Gynecology | Admitting: Obstetrics and Gynecology

## 2018-09-12 DIAGNOSIS — Z1231 Encounter for screening mammogram for malignant neoplasm of breast: Secondary | ICD-10-CM

## 2018-09-18 ENCOUNTER — Encounter: Payer: Self-pay | Admitting: *Deleted

## 2018-09-18 ENCOUNTER — Ambulatory Visit (INDEPENDENT_AMBULATORY_CARE_PROVIDER_SITE_OTHER): Payer: Medicaid Other | Admitting: Obstetrics

## 2018-09-18 ENCOUNTER — Other Ambulatory Visit (HOSPITAL_COMMUNITY)
Admission: RE | Admit: 2018-09-18 | Discharge: 2018-09-18 | Disposition: A | Discharge status: Home / Self Care | Payer: Medicaid Other | Source: Ambulatory Visit | Attending: Obstetrics | Admitting: Obstetrics

## 2018-09-18 ENCOUNTER — Encounter: Payer: Self-pay | Admitting: Obstetrics

## 2018-09-18 VITALS — BP 125/86 | HR 83 | Wt 237.2 lb

## 2018-09-18 DIAGNOSIS — Z3202 Encounter for pregnancy test, result negative: Secondary | ICD-10-CM | POA: Diagnosis not present

## 2018-09-18 DIAGNOSIS — N898 Other specified noninflammatory disorders of vagina: Secondary | ICD-10-CM | POA: Insufficient documentation

## 2018-09-18 DIAGNOSIS — N871 Moderate cervical dysplasia: Secondary | ICD-10-CM

## 2018-09-18 DIAGNOSIS — R87613 High grade squamous intraepithelial lesion on cytologic smear of cervix (HGSIL): Secondary | ICD-10-CM | POA: Diagnosis not present

## 2018-09-18 LAB — POCT URINE PREGNANCY: PREG TEST UR: NEGATIVE

## 2018-09-18 NOTE — Progress Notes (Signed)
Pt presents for colposcopy through the [facility] program.  Last pap 07/10/18 showed -HSIL: CIN-2/ CIN-3/CIS, HPV neg

## 2018-09-18 NOTE — Progress Notes (Signed)
Colposcopy Procedure Note  Indications: Pap smear 1 months ago showed: high-grade squamous intraepithelial neoplasia  (HGSIL-encompassing moderate and severe dysplasia). The prior pap showed unknown.  Prior cervical/vaginal disease: unknown . Prior cervical treatment: unknown.  Procedure Details  The risks and benefits of the procedure and Written informed consent obtained.  A time-out was performed confirming the patient, procedure and allergy status  Speculum placed in vagina and excellent visualization of cervix achieved, cervix swabbed x 3 with acetic acid solution.  Findings: Cervix: acetowhite lesion(s) noted at 8 o'clock and punctation noted at 8 o'clock; SCJ visualized - lesion at 8 o'clock, endocervical curettage performed, cervical biopsies taken at 6, 12, and 3 o'clock, specimen labelled and sent to pathology and hemostasis achieved with silver nitrate.   Vaginal inspection: normal without visible lesions. Vulvar colposcopy: vulvar colposcopy not performed.   Physical Exam   Specimens: ECC and cervical biopsies  Complications: none.  Plan: Specimens labelled and sent to Pathology. Will base further treatment on Pathology findings. Post biopsy instructions given to patient. Return to discuss Pathology results in 2 weeks    Brock BadHARLES A. Alahia Whicker MD 09-18-2018.

## 2018-09-19 MED ORDER — TINIDAZOLE 500 MG PO TABS: 1000.0000 mg | ORAL_TABLET | Freq: Every day | ORAL | 2 refills | Status: DC

## 2018-09-19 NOTE — Addendum Note (Signed)
Addended by: Coral CeoHARPER, Ricka Westra A on: 09/19/2018 04:30 PM   Modules accepted: Orders

## 2018-09-20 ENCOUNTER — Other Ambulatory Visit: Payer: Self-pay | Admitting: Obstetrics

## 2018-09-20 LAB — CERVICOVAGINAL ANCILLARY ONLY
Bacterial vaginitis: POSITIVE — AB
CANDIDA VAGINITIS: NEGATIVE
TRICH (WINDOWPATH): NEGATIVE

## 2018-10-05 ENCOUNTER — Encounter: Payer: Self-pay | Admitting: Obstetrics

## 2018-10-05 ENCOUNTER — Ambulatory Visit (INDEPENDENT_AMBULATORY_CARE_PROVIDER_SITE_OTHER): Payer: Medicaid Other | Admitting: Obstetrics

## 2018-10-05 VITALS — BP 122/79 | HR 108 | Wt 243.0 lb

## 2018-10-05 DIAGNOSIS — R87613 High grade squamous intraepithelial lesion on cytologic smear of cervix (HGSIL): Secondary | ICD-10-CM | POA: Diagnosis not present

## 2018-10-05 NOTE — Patient Instructions (Signed)
Loop Electrosurgical Excision Procedure    Loop electrosurgical excision procedure (LEEP) is the cutting and removal (excision) of part of the cervix. The cervix is the bottom part of the uterus that opens into the vagina. The tissue that is removed from the cervix is then examined to see if there are precancerous cells or cancer cells present. LEEP may be done when:  You have abnormal bleeding from your cervix.  You have an abnormal Pap test result.  Your health care provider finds an abnormality on your cervix during a pelvic exam.  LEEP typically only takes a few minutes and is often done in the health care provider's office. The procedure is safe for women who are trying to get pregnant. However, the procedure is usually not done during a menstrual period or during pregnancy.  Tell a health care provider about:  Any allergies you have.  All medicines you are taking, including vitamins, herbs, eye drops, creams, and over-the-counter medicines.  Any problems you or family members have had with anesthetic medicines.  Any blood disorders you have.  Any surgeries you have had.  Any medical conditions you have, including current or past vaginal infections, such as herpes or sexually transmitted infections (STIs).  Whether you are pregnant or may be pregnant.  What are the risks?  Generally, this is a safe procedure. However, problems may occur, including:  Infection.  Bleeding.  Allergic reactions to medicines.  Changes or scarring in the cervix.  Increased risk of early (preterm) labor in future pregnancies.  What happens before the procedure?  Ask your health care provider about:  Changing or stopping your regular medicines. This is especially important if you are taking diabetes medicines or blood thinners.  Taking medicines such as aspirin and ibuprofen. These medicines can thin your blood. Do not take these medicines before your procedure if your health care provider instructs you not to.  Your health care  provider may recommend that you take pain medicine before the procedure.  Plan to have someone take you home after the procedure.  What happens during the procedure?  An instrument called a speculum will be placed in your vagina. This will allow your health care provider to see the cervix.  You will be given a medicine to numb the area (local anesthetic). The medicine will be injected into your cervix and the surrounding area.  A solution will be applied to your cervix. This solution will help the health care provider find the abnormal cells that need to be removed.  A thin wire loop will be passed through your vagina. The wire will be used to burn (cauterize) the cervical tissue with an electrical current.  The abnormal cervical tissue will be removed.  Any open blood vessels will be cauterized to prevent bleeding.  A paste may be applied to the cauterized area of your cervix to help prevent bleeding.  The sample of cervical tissue will be examined under a microscope.  The procedure may vary among health care providers and hospitals.  What happens after the procedure?  You may have mild abdominal cramping.  You may have a small amount of bleeding (spotting) from the vagina.  You may have a dark-colored discharge coming from your vagina. This is from the paste that was used on the cervix to prevent bleeding.  Your health care provider may recommend pelvic rest. Pelvic rest generally means avoiding sex and not putting anything in the vagina, such as tampons, creams, and douches.  It is   your responsibility to get your test results. Ask your health care provider or the department performing the test when your results will be ready.  This information is not intended to replace advice given to you by your health care provider. Make sure you discuss any questions you have with your health care provider.  Document Released: 12/10/2002 Document Revised: 10/16/2015 Document Reviewed: 08/03/2015  [employer] Interactive Patient  Education  2019 [employer]

## 2018-10-05 NOTE — Progress Notes (Signed)
Patient ID: Tanya Stone, female   DOB: [birth year], 43 y.o.   MRN: [id number]  Chief Complaint  Patient presents with  . Follow-up    colpo results    HPI Tanya Stone is a 43 y.o. female.  History of HGSIL pap smear.  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  Patient presents today for results and management recommendations. HPI  History reviewed. No pertinent past medical history.  Past Surgical History:  Procedure Laterality Date  . CARPAL TUNNEL RELEASE    . FOOT SURGERY    . TUBAL LIGATION      Family History  Problem Relation Age of Onset  . Hypertension Mother   . Hypertension Father   . Diabetes Father     Social History Social History   Tobacco Use  . Smoking status: Former Smoker    Types: Cigars  . Smokeless tobacco: Never Used  Substance Use Topics  . Alcohol use: Yes    Comment: Once a month   . Drug use: No    No Known Allergies  Current Outpatient Medications  Medication Sig Dispense Refill  . acetaminophen (TYLENOL) 500 MG tablet Take 1,000 mg by mouth every 6 (six) hours as needed for moderate pain.    . baclofen (LIORESAL) 10 MG tablet Take 1 tablet (10 mg total) by mouth 3 (three) times daily. (Patient not taking: Reported on 09/18/2018) 30 each 0  . cephALEXin (KEFLEX) 500 MG capsule Take 1 capsule (500 mg total) by mouth 4 (four) times daily. (Patient not taking: Reported on 09/18/2018) 20 capsule 0  . cyclobenzaprine (FLEXERIL) 10 MG tablet Take 1 tablet (10 mg total) by mouth 2 (two) times daily as needed for muscle spasms. 20 tablet 0  . cyclobenzaprine (FLEXERIL) 10 MG tablet Take 1 tablet (10 mg total) by mouth 2 (two) times daily. (Patient not taking: Reported on 09/18/2018) 30 tablet 1  . ibuprofen (ADVIL,MOTRIN) 600 MG tablet Take 1 tablet (600 mg total) by mouth every 6 (six) hours as needed. (Patient not taking: Reported on 07/10/2018) 30 tablet 0  . lidocaine (XYLOCAINE) 2 % solution Use as directed 15 mLs  in the mouth or throat as needed for mouth pain. (Patient not taking: Reported on 01/30/2018) 100 mL 0  . meloxicam (MOBIC) 15 MG tablet Take 1 tablet (15 mg total) by mouth daily. Take 1 daily with food. (Patient not taking: Reported on 09/14/2017) 10 tablet 0  . methocarbamol (ROBAXIN) 500 MG tablet Take 1 tablet (500 mg total) by mouth 2 (two) times daily. (Patient not taking: Reported on 09/11/2018) 60 tablet 2  . naproxen (NAPROSYN) 500 MG tablet Take 1 tablet (500 mg total) by mouth 2 (two) times daily. (Patient not taking: Reported on 05/10/2018) 30 tablet 0  . ondansetron (ZOFRAN ODT) 4 MG disintegrating tablet Take 1 tablet (4 mg total) by mouth every 8 (eight) hours as needed for nausea or vomiting. (Patient not taking: Reported on 01/30/2018) 10 tablet 0  . phenazopyridine (PYRIDIUM) 200 MG tablet Take 1 tablet (200 mg total) by mouth 3 (three) times daily as needed for pain. (Patient not taking: Reported on 01/30/2018) 6 tablet 0  . promethazine-dextromethorphan (PROMETHAZINE-DM) 6.25-15 MG/5ML syrup Take 5 mLs by mouth 4 (four) times daily as needed for cough. (Patient not taking: Reported on 01/30/2018) 118 mL 0  . tinidazole (TINDAMAX) 500 MG tablet Take 2 tablets (1,000 mg total) by mouth daily with breakfast. 10 tablet 2   No current  facility-administered medications for this visit.     Review of Systems Review of Systems Constitutional: negative for fatigue and weight loss Respiratory: negative for cough and wheezing Cardiovascular: negative for chest pain, fatigue and palpitations Gastrointestinal: negative for abdominal pain and change in bowel habits Genitourinary:negative Integument/breast: negative for nipple discharge Musculoskeletal:negative for myalgias Neurological: negative for gait problems and tremors Behavioral/Psych: negative for abusive relationship, depression Endocrine: negative for temperature intolerance      Blood pressure 122/79, pulse (!) 108, weight 243 lb  (110.2 kg).  Physical Exam Physical Exam:  Deferred  >50% of 10 min visit spent on counseling and coordination of care.   Data Reviewed Pathology  Assessment     1. 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  No orders of the defined types were placed in this encounter.   Brock Bad MD 10-05-2018

## 2018-11-05 ENCOUNTER — Other Ambulatory Visit (HOSPITAL_COMMUNITY)
Admission: RE | Admit: 2018-11-05 | Discharge: 2018-11-05 | Disposition: A | Discharge status: Home / Self Care | Payer: Medicaid Other | Source: Ambulatory Visit | Attending: Obstetrics and Gynecology | Admitting: Obstetrics and Gynecology

## 2018-11-05 ENCOUNTER — Encounter: Payer: Self-pay | Admitting: Obstetrics and Gynecology

## 2018-11-05 ENCOUNTER — Ambulatory Visit (INDEPENDENT_AMBULATORY_CARE_PROVIDER_SITE_OTHER): Payer: Medicaid Other | Admitting: Obstetrics and Gynecology

## 2018-11-05 VITALS — BP 110/70 | HR 88 | Wt 241.5 lb

## 2018-11-05 DIAGNOSIS — Z3202 Encounter for pregnancy test, result negative: Secondary | ICD-10-CM

## 2018-11-05 DIAGNOSIS — N871 Moderate cervical dysplasia: Secondary | ICD-10-CM | POA: Insufficient documentation

## 2018-11-05 LAB — POCT URINE PREGNANCY: Preg Test, Ur: NEGATIVE

## 2018-11-05 NOTE — Addendum Note (Signed)
Addended by: Catalina Antigua on: 11/05/2018 02:08 PM   Modules accepted: Orders

## 2018-11-05 NOTE — Addendum Note (Signed)
Addended by: Frutoso ChaseOX, Julius Boniface on: 11/05/2018 02:07 PM   Modules accepted: Orders

## 2018-11-05 NOTE — Progress Notes (Signed)
Patient identified, informed consent obtained, signed copy in chart, time out performed.  Pap smear and colposcopy reviewed.   Pap HGSIl 07/2018 Colpo Biopsy CIN2 09/2018 ECC negative 09/2018 Teflon coated speculum with smoke evacuator placed.  Cervix visualized. Paracervical block placed.  A large size LOOP used to remove cone of cervix using blend of cut and cautery on LEEP machine.  Edges/Base cauterized with Ball.  Monsel's solution used for hemostasis.  Patient tolerated procedure well.  Patient given post procedure instructions.  Follow up in 4 months for repeat pap or as needed.

## 2018-11-05 NOTE — Progress Notes (Signed)
Cervical biopsy in 09/2018: HIGH GRADE SQUAMOUS INTRAEPITHELIAL LESION, CIN-II

## 2018-11-06 ENCOUNTER — Other Ambulatory Visit: Payer: Self-pay

## 2018-11-06 ENCOUNTER — Emergency Department (HOSPITAL_COMMUNITY)
Admission: EM | Admit: 2018-11-06 | Discharge: 2018-11-06 | Disposition: A | Discharge status: Home / Self Care | Payer: Medicaid Other | Attending: Emergency Medicine | Admitting: Emergency Medicine

## 2018-11-06 ENCOUNTER — Emergency Department (HOSPITAL_COMMUNITY): Payer: Medicaid Other

## 2018-11-06 ENCOUNTER — Encounter (HOSPITAL_COMMUNITY): Payer: Self-pay

## 2018-11-06 DIAGNOSIS — M546 Pain in thoracic spine: Secondary | ICD-10-CM | POA: Diagnosis not present

## 2018-11-06 DIAGNOSIS — Z79899 Other long term (current) drug therapy: Secondary | ICD-10-CM | POA: Insufficient documentation

## 2018-11-06 DIAGNOSIS — Z87891 Personal history of nicotine dependence: Secondary | ICD-10-CM | POA: Insufficient documentation

## 2018-11-06 DIAGNOSIS — M25551 Pain in right hip: Secondary | ICD-10-CM | POA: Insufficient documentation

## 2018-11-06 MED ORDER — ACETAMINOPHEN 325 MG PO TABS
650.0000 mg | ORAL_TABLET | Freq: Once | ORAL | Status: AC
  Administered 2018-11-06: 650 mg via ORAL
  Filled 2018-11-06: qty 2

## 2018-11-06 MED ORDER — METHOCARBAMOL 500 MG PO TABS
750.0000 mg | ORAL_TABLET | Freq: Once | ORAL | Status: AC
  Administered 2018-11-06: 750 mg via ORAL
  Filled 2018-11-06: qty 2

## 2018-11-06 MED ORDER — METHOCARBAMOL 500 MG PO TABS: 500.0000 mg | ORAL_TABLET | Freq: Two times a day (BID) | ORAL | 0 refills | Status: DC
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
## 2018-11-06 NOTE — Discharge Instructions (Signed)
Take Tylenol or Motrin as needed for pain.  Ice affected areas.  Take Robaxin as needed for muscle spasms.  Please follow-up with your primary care provider in 2 days for continued evaluation.  Return to the ED immediately for new or worsening symptoms, such as numbness, tingling, weakness, fevers, chest pain, shortness of breath or any concerns at all.
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
## 2018-11-26 ENCOUNTER — Encounter (HOSPITAL_COMMUNITY): Payer: Self-pay | Admitting: *Deleted

## 2018-12-14 ENCOUNTER — Ambulatory Visit: Payer: Medicaid Other | Admitting: Podiatry

## 2018-12-14 ENCOUNTER — Encounter (HOSPITAL_COMMUNITY): Payer: Self-pay | Admitting: *Deleted

## 2018-12-14 ENCOUNTER — Emergency Department (HOSPITAL_COMMUNITY)
Admission: EM | Admit: 2018-12-14 | Discharge: 2018-12-14 | Disposition: A | Discharge status: Home / Self Care | Payer: Medicaid Other | Attending: Emergency Medicine | Admitting: Emergency Medicine

## 2018-12-14 ENCOUNTER — Other Ambulatory Visit: Payer: Self-pay

## 2018-12-14 DIAGNOSIS — H00015 Hordeolum externum left lower eyelid: Secondary | ICD-10-CM | POA: Diagnosis not present

## 2018-12-14 DIAGNOSIS — Z87891 Personal history of nicotine dependence: Secondary | ICD-10-CM | POA: Insufficient documentation

## 2018-12-14 DIAGNOSIS — Z79899 Other long term (current) drug therapy: Secondary | ICD-10-CM | POA: Diagnosis not present

## 2018-12-14 DIAGNOSIS — H019 Unspecified inflammation of eyelid: Secondary | ICD-10-CM | POA: Diagnosis present

## 2018-12-14 MED ORDER — ERYTHROMYCIN 5 MG/GM OP OINT
1.0000 "application " | TOPICAL_OINTMENT | Freq: Once | OPHTHALMIC | Status: AC
  Administered 2018-12-14: 1 via OPHTHALMIC
  Filled 2018-12-14: qty 3.5

## 2018-12-14 NOTE — ED Notes (Signed)
Bed: [facility] Expected date:  Expected time:  Means of arrival:  Comments: 

## 2018-12-14 NOTE — ED Provider Notes (Signed)
[facility]-EMERGENCY DEPT Provider Note   CSN: [id number] Arrival date & time: 12/14/18  0039    History   Chief Complaint Chief Complaint  Patient presents with   Eye Problem    left    HPI Tanya Stone is a 43 y.o. female.     Patient presents to the emergency department with a chief complaint of left lower eyelid pain.  She states she noticed the pain today.  Denies any injury.  States that she felt like something was in her eye, but then she looked in the mirror and noticed a small bump on her eyelid.  Denies any treatments prior to arrival.  Denies any fevers.  Denies any vision loss.  Denies any other associated symptoms.  The history is provided by the patient. No language interpreter was used.    History reviewed. No pertinent past medical history.  There are no active problems to display for this patient.   Past Surgical History:  Procedure Laterality Date   CARPAL TUNNEL RELEASE     FOOT SURGERY     TUBAL LIGATION       OB History    Gravida  3   Para  3   Term      Preterm      AB      Living  3     SAB      TAB      Ectopic      Multiple      Live Births  3            Home Medications    Prior to Admission medications   Medication Sig Start Date End Date Taking? Authorizing Provider  acetaminophen (TYLENOL) 500 MG tablet Take 1,000 mg by mouth every 6 (six) hours as needed for moderate pain.    [provider]  cephALEXin (KEFLEX) 500 MG capsule Take 1 capsule (500 mg total) by mouth 4 (four) times daily. Patient not taking: Reported on 09/18/2018 09/14/17   Melene Plan, DO  ibuprofen (ADVIL,MOTRIN) 600 MG tablet Take 1 tablet (600 mg total) by mouth every 6 (six) hours as needed. Patient not taking: Reported on 07/10/2018 05/10/18   Elpidio Anis, PA-C  lidocaine (XYLOCAINE) 2 % solution Use as directed 15 mLs in the mouth or throat as needed for mouth pain. Patient not taking: Reported on  01/30/2018 09/24/17   McDonald, Pedro Earls A, PA-C  meloxicam (MOBIC) 15 MG tablet Take 1 tablet (15 mg total) by mouth daily. Take 1 daily with food. Patient not taking: Reported on 09/14/2017 04/04/17   Arthor Captain, PA-C  methocarbamol (ROBAXIN) 500 MG tablet Take 1 tablet (500 mg total) by mouth 2 (two) times daily. 11/06/18   Kendrick, Caitlyn S, PA-C  naproxen (NAPROSYN) 500 MG tablet Take 1 tablet (500 mg total) by mouth 2 (two) times daily. Patient not taking: Reported on 05/10/2018 03/30/18   Dartha Lodge, PA-C  ondansetron (ZOFRAN ODT) 4 MG disintegrating tablet Take 1 tablet (4 mg total) by mouth every 8 (eight) hours as needed for nausea or vomiting. Patient not taking: Reported on 01/30/2018 09/21/17   Michela Pitcher A, PA-C  phenazopyridine (PYRIDIUM) 200 MG tablet Take 1 tablet (200 mg total) by mouth 3 (three) times daily as needed for pain. Patient not taking: Reported on 01/30/2018 09/14/17   Melene Plan, DO  promethazine-dextromethorphan (PROMETHAZINE-DM) 6.25-15 MG/5ML syrup Take 5 mLs by mouth 4 (four) times daily as needed for cough. Patient  not taking: Reported on 01/30/2018 09/24/17   Frederik Pear A, PA-C  tinidazole (TINDAMAX) 500 MG tablet Take 2 tablets (1,000 mg total) by mouth daily with breakfast. Patient not taking: Reported on 11/05/2018 09/19/18   Brock Bad, MD    Family History Family History  Problem Relation Age of Onset   Hypertension Mother    Hypertension Father    Diabetes Father     Social History Social History   Tobacco Use   Smoking status: Former Smoker    Types: Cigars   Smokeless tobacco: Never Used  Substance Use Topics   Alcohol use: Yes    Comment: Once a month    Drug use: No     Allergies   Patient has no known allergies.   Review of Systems Review of Systems  All other systems reviewed and are negative.    Physical Exam Updated Vital Signs BP 125/75 (BP Location: Left Arm)    Pulse 78    Temp 98.5 F (36.9 C) (Oral)     Resp 14    Ht 5\' 9"  (1.753 m)    Wt 109.8 kg    LMP 12/07/2018 (Exact Date)    SpO2 100%    BMI 35.74 kg/m   Physical Exam Vitals signs and nursing note reviewed.  Constitutional:      General: She is not in acute distress.    Appearance: She is not diaphoretic.  HENT:     Head: Normocephalic and atraumatic.  Eyes:     Conjunctiva/sclera: Conjunctivae normal.     Pupils: Pupils are equal, round, and reactive to light.     Comments: Left lower eyelid notable for stye  Neck:     Trachea: No tracheal deviation.  Cardiovascular:     Rate and Rhythm: Normal rate.  Pulmonary:     Effort: Pulmonary effort is normal. No respiratory distress.  Abdominal:     Palpations: Abdomen is soft.  Musculoskeletal: Normal range of motion.  Skin:    General: Skin is warm and dry.  Neurological:     Mental Status: She is alert and oriented to person, place, and time.  Psychiatric:        Judgment: Judgment normal.      ED Treatments / Results  Labs (all labs ordered are listed, but only abnormal results are displayed) Labs Reviewed - No data to display  EKG None  Radiology No results found.  Procedures Procedures (including critical care time)  Medications Ordered in ED Medications  erythromycin ophthalmic ointment 1 application (has no administration in time range)     Initial Impression / Assessment and Plan / ED Course  I have reviewed the triage vital signs and the nursing notes.  Pertinent labs & imaging results that were available during my care of the patient were reviewed by me and considered in my medical decision making (see chart for details).        Patient with small stye.  Tx with romycin.  Ophthalmology follow-up as needed.  Final Clinical Impressions(s) / ED Diagnoses   Final diagnoses:  Hordeolum externum of left lower eyelid    ED Discharge Orders    None       Roxy Horseman, PA-C 12/14/18 0319    Ward, Layla Maw, DO 12/14/18 [phone]

## 2018-12-14 NOTE — ED Triage Notes (Signed)
Pt c/o "bump" to left eye.  Pt presents with reddened area to left inner lower lid.  Minimal edema noted.

## 2018-12-17 ENCOUNTER — Encounter: Payer: Self-pay | Admitting: Podiatry

## 2018-12-17 ENCOUNTER — Ambulatory Visit: Payer: Medicaid Other

## 2018-12-17 ENCOUNTER — Ambulatory Visit (INDEPENDENT_AMBULATORY_CARE_PROVIDER_SITE_OTHER): Payer: Medicaid Other

## 2018-12-17 ENCOUNTER — Other Ambulatory Visit: Payer: Self-pay

## 2018-12-17 ENCOUNTER — Ambulatory Visit (INDEPENDENT_AMBULATORY_CARE_PROVIDER_SITE_OTHER): Payer: Medicaid Other | Admitting: Podiatry

## 2018-12-17 VITALS — BP 113/64

## 2018-12-17 DIAGNOSIS — M79671 Pain in right foot: Secondary | ICD-10-CM | POA: Diagnosis not present

## 2018-12-17 DIAGNOSIS — M79672 Pain in left foot: Secondary | ICD-10-CM

## 2018-12-17 DIAGNOSIS — L988 Other specified disorders of the skin and subcutaneous tissue: Secondary | ICD-10-CM

## 2018-12-17 DIAGNOSIS — M722 Plantar fascial fibromatosis: Secondary | ICD-10-CM

## 2018-12-17 MED ORDER — MELOXICAM 15 MG PO TABS: 15.0000 mg | ORAL_TABLET | Freq: Every day | ORAL | 0 refills | Status: AC

## 2018-12-17 MED ORDER — TRIAMCINOLONE ACETONIDE 10 MG/ML IJ SUSP
10.0000 mg | Freq: Once | INTRAMUSCULAR | Status: AC
  Administered 2018-12-17: 10 mg

## 2018-12-17 MED ORDER — CASTELLANI PAINT 1.5 % EX LIQD: 1.0000 "application " | Freq: Every day | CUTANEOUS | 1 refills | Status: DC

## 2018-12-17 NOTE — Patient Instructions (Signed)

## 2018-12-18 ENCOUNTER — Other Ambulatory Visit: Payer: Self-pay | Admitting: Podiatry

## 2018-12-18 DIAGNOSIS — M722 Plantar fascial fibromatosis: Secondary | ICD-10-CM

## 2018-12-18 NOTE — Progress Notes (Signed)
Subjective:   Patient ID: Tanya Stone, female   DOB: 43 y.o.   MRN: [id number]   HPI 43 year old female presents the office today for concerns of bilateral heel pain with the left side worse than right.  She states this is been ongoing for quite some time.  She previously did have a steroid injection which helped for short amount of time.  She states the pain is worse in the morning when she gets up after sitting on her feet all day.  She does get some tingling to the heel for being on her feet.  She denies any radiating pain.  No recent injury or trauma.  She also has white discoloration between her toes and is between her fourth and fifth toes.  There is no drainage or pus.  This is been a chronic issue for her as well.  She has no other concerns.   Review of Systems  All other systems reviewed and are negative.  No past medical history on file.  Past Surgical History:  Procedure Laterality Date  . CARPAL TUNNEL RELEASE    . FOOT SURGERY    . TUBAL LIGATION       Current Outpatient Medications:  .  acetaminophen (TYLENOL) 500 MG tablet, Take 1,000 mg by mouth every 6 (six) hours as needed for moderate pain., Disp: , Rfl:  .  cephALEXin (KEFLEX) 500 MG capsule, Take 1 capsule (500 mg total) by mouth 4 (four) times daily., Disp: 20 capsule, Rfl: 0 .  ibuprofen (ADVIL,MOTRIN) 600 MG tablet, Take 1 tablet (600 mg total) by mouth every 6 (six) hours as needed., Disp: 30 tablet, Rfl: 0 .  lidocaine (XYLOCAINE) 2 % solution, Use as directed 15 mLs in the mouth or throat as needed for mouth pain., Disp: 100 mL, Rfl: 0 .  methocarbamol (ROBAXIN) 500 MG tablet, Take 1 tablet (500 mg total) by mouth 2 (two) times daily., Disp: 20 tablet, Rfl: 0 .  naproxen (NAPROSYN) 500 MG tablet, Take 1 tablet (500 mg total) by mouth 2 (two) times daily., Disp: 30 tablet, Rfl: 0 .  ondansetron (ZOFRAN ODT) 4 MG disintegrating tablet, Take 1 tablet (4 mg total) by mouth every 8 (eight) hours as needed for  nausea or vomiting., Disp: 10 tablet, Rfl: 0 .  phenazopyridine (PYRIDIUM) 200 MG tablet, Take 1 tablet (200 mg total) by mouth 3 (three) times daily as needed for pain., Disp: 6 tablet, Rfl: 0 .  promethazine-dextromethorphan (PROMETHAZINE-DM) 6.25-15 MG/5ML syrup, Take 5 mLs by mouth 4 (four) times daily as needed for cough., Disp: 118 mL, Rfl: 0 .  tinidazole (TINDAMAX) 500 MG tablet, Take 2 tablets (1,000 mg total) by mouth daily with breakfast., Disp: 10 tablet, Rfl: 2 .  Castellani Paint 1.5 % LIQD, Apply 1 application topically daily., Disp: 1 Bottle, Rfl: 1 .  meloxicam (MOBIC) 15 MG tablet, Take 1 tablet (15 mg total) by mouth daily., Disp: 30 tablet, Rfl: 0  No Known Allergies        Objective:  Physical Exam  General: AAO x3, NAD  Dermatological: Interdigital maceration present" mostly on the first and fourth interspaces bilaterally.  There is no drainage or pus identified there is no erythema or ascending cellulitis.  No fluctuation crepitation or any malodor.  Vascular: Dorsalis Pedis artery and Posterior Tibial artery pedal pulses are 2/4 bilateral with immedate capillary fill time. There is no pain with calf compression, swelling, warmth, erythema.   Neruologic: Grossly intact via light touch bilateral. Protective  threshold with Semmes Wienstein monofilament intact to all pedal sites bilateral.   Musculoskeletal: There is tenderness palpation of the plantar medial tubercle of the calcaneus at the insertion of the fascia the left side worse than the right.  There is no pain on the course of the plantar fascia.  Minimal discomfort with posterior heel there is no pain on the Achilles tendon.  Thompson test is negative.  No edema, erythema.  Negative Tinel sign.  No other areas of tenderness elicited at this time.  Gait: Unassisted, Nonantalgic.      Assessment:   Bilateral heel pain, plantar fasciitis; interdigital maceration    Plan:  -Treatment options discussed  including all alternatives, risks, and complications -Etiology of symptoms were discussed -X-rays were obtained and reviewed with the patient.  There is no evidence of acute fracture or stress fracture identified today. -Steroid injection performed bilaterally.  See procedure note below. -Prescribed mobic. Discussed side effects of the medication and directed to stop if any are to occur and call the office.  -Discussed shoe modifications and orthotics -Recommend a night splint.  She is now purchase an over-the-counter night splint. -Applied again prescribed Castellani paint to apply interdigitally.  Dry thoroughly between the toes.  Discussed measures to help prevent excess sweating of her feet.  Procedure: Injection Tendon/Ligament Discussed alternatives, risks, complications and verbal consent was obtained.  Location: Bilateral plantar fascia at the glabrous junction; medial approach. Skin Prep: Alcohol  Injectate: 0.5cc 0.5% marcaine plain, 0.5 cc 2% lidocaine plain and, 1 cc kenalog 10. Disposition: Patient tolerated procedure well. Injection site dressed with a band-aid.  Post-injection care was discussed and return precautions discussed.    Return in about 4 weeks (around 01/14/2019).  Vivi Barrack DPM

## 2018-12-19 ENCOUNTER — Other Ambulatory Visit: Payer: Self-pay | Admitting: Podiatry

## 2018-12-19 DIAGNOSIS — M79671 Pain in right foot: Secondary | ICD-10-CM

## 2019-01-14 ENCOUNTER — Encounter: Payer: Self-pay | Admitting: Podiatry

## 2019-01-14 ENCOUNTER — Other Ambulatory Visit: Payer: Self-pay

## 2019-01-14 ENCOUNTER — Ambulatory Visit: Payer: Medicaid Other | Admitting: Podiatry

## 2019-01-14 ENCOUNTER — Ambulatory Visit (INDEPENDENT_AMBULATORY_CARE_PROVIDER_SITE_OTHER): Payer: Medicaid Other | Admitting: Podiatry

## 2019-01-14 VITALS — Temp 97.5°F

## 2019-01-14 DIAGNOSIS — M7751 Other enthesopathy of right foot: Secondary | ICD-10-CM

## 2019-01-14 DIAGNOSIS — M722 Plantar fascial fibromatosis: Secondary | ICD-10-CM | POA: Diagnosis not present

## 2019-01-14 DIAGNOSIS — M779 Enthesopathy, unspecified: Secondary | ICD-10-CM

## 2019-01-14 DIAGNOSIS — M21621 Bunionette of right foot: Secondary | ICD-10-CM

## 2019-01-14 DIAGNOSIS — M216X1 Other acquired deformities of right foot: Secondary | ICD-10-CM

## 2019-01-14 DIAGNOSIS — L988 Other specified disorders of the skin and subcutaneous tissue: Secondary | ICD-10-CM

## 2019-01-15 ENCOUNTER — Telehealth: Payer: Self-pay | Admitting: Podiatry

## 2019-01-15 MED ORDER — DICLOFENAC SODIUM 1 % TD GEL: 2.0000 g | Freq: Four times a day (QID) | TRANSDERMAL | 2 refills | Status: DC

## 2019-01-15 NOTE — Telephone Encounter (Signed)
Pt was seen yesterday in office and was supposed to have a medicated cream sent in to her pharmacy. Pt called the pharmacy and they have not received the prescription. Pt calling to follow up.

## 2019-01-15 NOTE — Telephone Encounter (Signed)
sent 

## 2019-01-15 NOTE — Telephone Encounter (Signed)
Left message informing pt Dr. Ardelle Anton had sent orders to [facility].

## 2019-01-17 NOTE — Progress Notes (Signed)
Subjective: 43 year old female presents the office today for evaluation of bilateral foot pain.  She says the heels are doing much better since the injection she said no significant discomfort.  Her only area of pain today she reports to the outside aspect of right foot she points on the fifth metatarsal head.  She states there is been no redness however she is causing discomfort.  She denies any recent injury or trauma.  She also still has the white discoloration between her toes most between her fourth and fifth toes.  She was not able to get the cast on his pain.  She denies any drainage or pus or any swelling or redness to this area. Denies any systemic complaints such as fevers, chills, nausea, vomiting. No acute changes since last appointment, and no other complaints at this time.   Objective: AAO x3, NAD DP/PT pulses palpable bilaterally, CRT less than 3 seconds This time there is no tenderness palpation of the plantar medial tubercle of the calcaneus at the insertion plantar fascia bilaterally.  No pain with lateral compression of calcaneus along the course or insertion of the Achilles tendon.  The only area of discomfort is to the right foot to the fifth metatarsal head along.  Tailors bunion.  Minimal edema.  There is no erythema or warmth.  No other areas of pinpoint tenderness.  Interdigital maceration is present mostly along the fourth interspaces bilaterally but there is no drainage or pus or any swelling or redness or red streaks.  No open lesions or pre-ulcerative lesions.  No pain with calf compression, swelling, warmth, erythema  Assessment: 43 year old female with capsulitis right foot/ tailors bunion; interdigital maceration; resolving heel pain, plantar fasciitis  Plan: -All treatment options discussed with the patient including all alternatives, risks, complications.  -Regards to the heel pain continue with stretching, ice exercises daily as well as wearing supportive  shoes. -Steroid injection performed to the right fifth MPJ.  See procedure note below.  Continue offloading with offloading pads were dispensed today.  Discussed shoe modifications as well. -Recommended a small meta Betadine interdigitally daily.  Also discussed lambs wool.  -Patient encouraged to call the office with any questions, concerns, change in symptoms.   Procedure: Injection small joint Discussed alternatives, risks, complications and verbal consent was obtained.  Location: Right fifth MPJ Skin Prep: Betadine Injectate: 0.5cc 0.5% marcaine plain, 0.5 cc dexamethasone phosphate Disposition: Patient tolerated procedure well. Injection site dressed with a band-aid.  Post-injection care was discussed and return precautions discussed.   Return in about 4 weeks (around 02/11/2019).  Vivi Barrack DPM

## 2019-02-06 ENCOUNTER — Encounter (HOSPITAL_COMMUNITY): Payer: Self-pay

## 2019-02-06 ENCOUNTER — Emergency Department (HOSPITAL_COMMUNITY)
Admission: EM | Admit: 2019-02-06 | Discharge: 2019-02-07 | Disposition: A | Discharge status: Home / Self Care | Payer: Medicaid Other | Attending: Emergency Medicine | Admitting: Emergency Medicine

## 2019-02-06 ENCOUNTER — Other Ambulatory Visit: Payer: Self-pay

## 2019-02-06 DIAGNOSIS — R112 Nausea with vomiting, unspecified: Secondary | ICD-10-CM | POA: Diagnosis not present

## 2019-02-06 DIAGNOSIS — M546 Pain in thoracic spine: Secondary | ICD-10-CM | POA: Diagnosis not present

## 2019-02-06 DIAGNOSIS — F17290 Nicotine dependence, other tobacco product, uncomplicated: Secondary | ICD-10-CM | POA: Insufficient documentation

## 2019-02-06 LAB — CBC WITH DIFFERENTIAL/PLATELET
Abs Immature Granulocytes: 0 10*3/uL (ref 0.00–0.07)
Basophils Absolute: 0 10*3/uL (ref 0.0–0.1)
Basophils Relative: 0 %
Eosinophils Absolute: 0.1 10*3/uL (ref 0.0–0.5)
Eosinophils Relative: 2 %
HCT: 34.3 % — ABNORMAL LOW (ref 36.0–46.0)
Hemoglobin: 11.6 g/dL — ABNORMAL LOW (ref 12.0–15.0)
Immature Granulocytes: 0 %
Lymphocytes Relative: 38 %
Lymphs Abs: 1.7 10*3/uL (ref 0.7–4.0)
MCH: 29.9 pg (ref 26.0–34.0)
MCHC: 33.8 g/dL (ref 30.0–36.0)
MCV: 88.4 fL (ref 80.0–100.0)
Monocytes Absolute: 0.4 10*3/uL (ref 0.1–1.0)
Monocytes Relative: 9 %
Neutro Abs: 2.2 10*3/uL (ref 1.7–7.7)
Neutrophils Relative %: 51 %
Platelets: 223 10*3/uL (ref 150–400)
RBC: 3.88 MIL/uL (ref 3.87–5.11)
RDW: 15.1 % (ref 11.5–15.5)
WBC: 4.5 10*3/uL (ref 4.0–10.5)
nRBC: 0 % (ref 0.0–0.2)

## 2019-02-06 LAB — BASIC METABOLIC PANEL
Anion gap: 8 (ref 5–15)
BUN: 13 mg/dL (ref 6–20)
CO2: 27 mmol/L (ref 22–32)
Calcium: 9.3 mg/dL (ref 8.9–10.3)
Chloride: 104 mmol/L (ref 98–111)
Creatinine, Ser: 1.06 mg/dL — ABNORMAL HIGH (ref 0.44–1.00)
GFR calc Af Amer: >60 mL/min (ref >60)
GFR calc non Af Amer: >60 mL/min (ref >60)
Glucose, Bld: 92 mg/dL (ref 70–99)
Potassium: 3.9 mmol/L (ref 3.5–5.1)
Sodium: 139 mmol/L (ref 135–145)

## 2019-02-06 MED ORDER — STERILE WATER FOR INJECTION IJ SOLN
INTRAMUSCULAR | Status: AC
  Administered 2019-02-06: 1.5 mL
  Filled 2019-02-06: qty 10

## 2019-02-06 MED ORDER — SODIUM CHLORIDE 0.9 % IV BOLUS
1000.0000 mL | Freq: Once | INTRAVENOUS | Status: AC
  Administered 2019-02-06: 1000 mL via INTRAVENOUS

## 2019-02-06 MED ORDER — CEFTRIAXONE SODIUM 250 MG IJ SOLR
250.0000 mg | Freq: Once | INTRAMUSCULAR | Status: AC
  Administered 2019-02-06: 23:00:00 250 mg via INTRAMUSCULAR
  Filled 2019-02-06: qty 250

## 2019-02-06 MED ORDER — ONDANSETRON HCL 4 MG/2ML IJ SOLN
4.0000 mg | Freq: Once | INTRAMUSCULAR | Status: AC
  Administered 2019-02-06: 4 mg via INTRAVENOUS
  Filled 2019-02-06: qty 2

## 2019-02-06 MED ORDER — METHOCARBAMOL 500 MG PO TABS
500.0000 mg | ORAL_TABLET | Freq: Once | ORAL | Status: AC
  Administered 2019-02-06: 23:00:00 500 mg via ORAL
  Filled 2019-02-06: qty 1

## 2019-02-06 MED ORDER — KETOROLAC TROMETHAMINE 30 MG/ML IJ SOLN
30.0000 mg | Freq: Once | INTRAMUSCULAR | Status: DC
  Filled 2019-02-06: qty 1

## 2019-02-06 MED ORDER — AZITHROMYCIN 250 MG PO TABS
1000.0000 mg | ORAL_TABLET | Freq: Once | ORAL | Status: AC
  Administered 2019-02-06: 23:00:00 1000 mg via ORAL
  Filled 2019-02-06: qty 4

## 2019-02-06 NOTE — ED Provider Notes (Signed)
[facility]-EMERGENCY DEPT Provider Note   CSN: [id number] Arrival date & time: 02/06/19  2103    History   Chief Complaint No chief complaint on file.   HPI Tanya Stone is a [age] y.o. female.     The history is provided by the patient and medical records.    43 year old female presenting to the ED with multiple concerns.  1.  Left back pain-- states has been ongoing for a few weeks now.  Saw PCP for this and was prescribed meloxicam but has not had any relief.  She states it feels "tight".  She denies any injury, trauma, or falls.  No numbness or weakness of the legs.  No bowel or bladder incontinence.  She does report frequent urination but that is unchanged from her baseline.  She denies any dysuria or hematuria.  She is not had any pelvic pain or vaginal discharge.  Recent STD testing in February with GYN that was negative.  She has not been sexually active in over a month.  2.  Nausea and vomiting--her partner had similar symptoms and was seen recently and diagnosed with acid reflux as well as " NGU".  States he was given medications during that visit and told that he will be contacted if any abnormal results.  He was sent home with medications for reflux.  They have not had any abnormal food intake or recent travel.  States she vomited twice yesterday and once today, all have been nonbloody and nonbilious.  She denies any diarrhea.  No current abdominal pain.  No fever or known COVID exposures.  History reviewed. No pertinent past medical history.  There are no active problems to display for this patient.   Past Surgical History:  Procedure Laterality Date  . CARPAL TUNNEL RELEASE    . FOOT SURGERY    . TUBAL LIGATION       OB History    Gravida  3   Para  3   Term      Preterm      AB      Living  3     SAB      TAB      Ectopic      Multiple      Live Births  3            Home Medications    Prior to Admission  medications   Medication Sig Start Date End Date Taking? Authorizing Provider  acetaminophen (TYLENOL) 500 MG tablet Take 1,000 mg by mouth every 6 (six) hours as needed for moderate pain.    [provider]  Robyne Askewastellani Paint 1.5 % LIQD Apply 1 application topically daily. 12/17/18   Vivi BarrackWagoner, Matthew R, DPM  diclofenac sodium (VOLTAREN) 1 % GEL Apply 2 g topically 4 (four) times daily. Rub into affected area of foot 2 to 4 times daily 01/15/19   Vivi BarrackWagoner, Matthew R, DPM  ibuprofen (ADVIL,MOTRIN) 600 MG tablet Take 1 tablet (600 mg total) by mouth every 6 (six) hours as needed. 05/10/18   Elpidio AnisUpstill, Shari, PA-C  meloxicam (MOBIC) 15 MG tablet Take 1 tablet (15 mg total) by mouth daily. 12/17/18 12/17/19  Vivi BarrackWagoner, Matthew R, DPM    Family History Family History  Problem Relation Age of Onset  . Hypertension Mother   . Hypertension Father   . Diabetes Father     Social History Social History   Tobacco Use  . Smoking status: Former Smoker  Types: Cigars  . Smokeless tobacco: Never Used  Substance Use Topics  . Alcohol use: Yes    Comment: Once a month   . Drug use: No     Allergies   Patient has no known allergies.   Review of Systems Review of Systems  Gastrointestinal: Positive for nausea and vomiting.  Musculoskeletal: Positive for back pain.  All other systems reviewed and are negative.    Physical Exam Updated Vital Signs BP (!) 150/99 (BP Location: Left Arm)   Pulse 83   Temp 98.5 F (36.9 C) (Oral)   Resp 18   LMP 01/31/2019   SpO2 100%   Physical Exam Vitals signs and nursing note reviewed.  Constitutional:      Appearance: She is well-developed.     Comments: Sitting up in bed on phone, NAD  HENT:     Head: Normocephalic and atraumatic.  Eyes:     Conjunctiva/sclera: Conjunctivae normal.     Pupils: Pupils are equal, round, and reactive to light.  Neck:     Musculoskeletal: Normal range of motion.  Cardiovascular:     Rate and Rhythm: Normal  rate and regular rhythm.     Heart sounds: Normal heart sounds.  Pulmonary:     Effort: Pulmonary effort is normal.     Breath sounds: Normal breath sounds.  Abdominal:     General: Bowel sounds are normal.     Palpations: Abdomen is soft.     Tenderness: There is no abdominal tenderness. There is no right CVA tenderness or left CVA tenderness.     Comments: Soft, non-tender, normal bowel sounds, no distention, no CVA tenderness  Musculoskeletal: Normal range of motion.       Back:     Comments: Tension and spasm in the areas depicted, no midline deformities or step-off; full ROM maintained  Skin:    General: Skin is warm and dry.  Neurological:     Mental Status: She is alert and oriented to person, place, and time.      ED Treatments / Results  Labs (all labs ordered are listed, but only abnormal results are displayed) Labs Reviewed  CBC WITH DIFFERENTIAL/PLATELET - Abnormal; Notable for the following components:      Result Value   Hemoglobin 11.6 (*)    HCT 34.3 (*)    All other components within normal limits  BASIC METABOLIC PANEL - Abnormal; Notable for the following components:   Creatinine, Ser 1.06 (*)    All other components within normal limits  URINALYSIS, ROUTINE W REFLEX MICROSCOPIC - Abnormal; Notable for the following components:   APPearance HAZY (*)    Hgb urine dipstick MODERATE (*)    Bacteria, UA RARE (*)    All other components within normal limits  URINE CULTURE  I-STAT BETA HCG BLOOD, ED ([facility], [facility], [facility] ONLY)    EKG None  Radiology No results found.  Procedures Procedures (including critical care time)  Medications Ordered in ED Medications  cefTRIAXone (ROCEPHIN) injection 250 mg (250 mg Intramuscular Given 02/06/19 2313)  azithromycin (ZITHROMAX) tablet 1,000 mg (1,000 mg Oral Given 02/06/19 2314)  sodium chloride 0.9 % bolus 1,000 mL (1,000 mLs Intravenous New Bag/Given 02/06/19 2319)  ondansetron (ZOFRAN) injection 4 mg (4 mg Intravenous  Given 02/06/19 2314)  sterile water (preservative free) injection (1.5 mLs  Given 02/06/19 2313)  methocarbamol (ROBAXIN) tablet 500 mg (500 mg Oral Given 02/06/19 2328)     Initial Impression / Assessment and Plan / ED  Course  I have reviewed the triage vital signs and the nursing notes.  Pertinent labs & imaging results that were available during my care of the patient were reviewed by me and considered in my medical decision making (see chart for details).  43 year old female presenting to the ED with multiple concerns.  1.  Left-sided back pain--has been ongoing for a few weeks now.  Has seen PCP for this and treated with meloxicam without much improvement.  No injury, trauma, or falls.  No focal deficits noted here suggestive of cauda equina.  Has muscular tenderness and spasm on exam of left back.  No CVA tenderness.  She denies any urinary symptoms, pelvic pain, or vaginal discharge.  Of note, partner was recently diagnosed with suspected "NGU".  He was treated at that visit.  Patient reports recent STD testing in February that was negative and has not been sexually active in over a month.  Will obtain UA w/culture.  Unsure if partner truly has gonorrhea, but will treat empirically here with Rocpehin/azithromycin.    2.  Nausea and vomiting-- partner with same recently.  No abnormal food intake or travel.  Episodes have been non-bloody and non-bilious.  Denies abdominal pain and abdomen is soft and non-tender.  No fever or known COVID exposures. Labs pending.  Given IVF and zofran.  12:32 AM Patient feeling better after treatment here.  Tolerated several cups of water without recurrent nausea/emesis.  Back pain improved with robaxin.  VSS.  We reviewed labs and imaging studies-- overall reassuring.  UA does have blood noted, however patient just finished menstrual cycle yesterday.  Doubt this represents acute stone/obstruction.  Back pain seems more MSK in nature, will continue to treat  symptomatically.  N/V likely viral as partner with similar.  She does not have any fever or URI symptoms suggestive of COVID and I have low suspicion for such. She appears stable for discharge home.  Reminded to wait at least 1 week until sexual activity to allow medications to take effect.  She can follow-up with PCP.  Return here for any new/acute changes.  Tanya Stone was evaluated in Emergency Department on 02/07/2019 for the symptoms described in the history of present illness. She was evaluated in the context of the global COVID-19 pandemic, which necessitated consideration that the patient might be at risk for infection with the SARS-CoV-2 virus that causes COVID-19. Institutional protocols and algorithms that pertain to the evaluation of patients at risk for COVID-19 are in a state of rapid change based on information released by regulatory bodies including the CDC and federal and state organizations. These policies and algorithms were followed during the patient's care in the ED.   Final Clinical Impressions(s) / ED Diagnoses   Final diagnoses:  Nausea and vomiting in adult patient  Acute left-sided thoracic back pain    ED Discharge Orders         Ordered    methocarbamol (ROBAXIN) 500 MG tablet  2 times daily     02/07/19 0039    ondansetron (ZOFRAN ODT) 4 MG disintegrating tablet  Every 8 hours PRN     02/07/19 0039           Garlon Hatchet, PA-C 02/07/19 0043    Molpus, Jonny Ruiz, MD 02/07/19 [phone]

## 2019-02-06 NOTE — ED Triage Notes (Signed)
Pt complains of left lower back pain for about a week, she states that she vomited yesterday and twice today, pt doesn't have any urinary complaints and still has her gallbladder

## 2019-02-07 LAB — URINALYSIS, ROUTINE W REFLEX MICROSCOPIC
Bilirubin Urine: NEGATIVE
Glucose, UA: NEGATIVE mg/dL
Ketones, ur: NEGATIVE mg/dL
Leukocytes,Ua: NEGATIVE
Nitrite: NEGATIVE
Protein, ur: NEGATIVE mg/dL
Specific Gravity, Urine: 1.015 (ref 1.005–1.030)
pH: 5 (ref 5.0–8.0)

## 2019-02-07 LAB — I-STAT BETA HCG BLOOD, ED (MC, WL, AP ONLY): I-stat hCG, quantitative: <5 m[IU]/mL (ref <5)

## 2019-02-07 MED ORDER — ONDANSETRON 4 MG PO TBDP: 4.0000 mg | ORAL_TABLET | Freq: Three times a day (TID) | ORAL | 0 refills | Status: DC | PRN

## 2019-02-07 MED ORDER — METHOCARBAMOL 500 MG PO TABS: 500.0000 mg | ORAL_TABLET | Freq: Two times a day (BID) | ORAL | 0 refills | Status: DC

## 2019-02-07 NOTE — Discharge Instructions (Signed)
Take the prescribed medication as directed.  Can also use heat on the back to see if this helps relax the muscles. Follow-up with your primary care doctor. Return to the ED for new or worsening symptoms.

## 2019-02-08 LAB — URINE CULTURE

## 2019-02-11 ENCOUNTER — Ambulatory Visit: Payer: Medicaid Other | Admitting: Podiatry

## 2019-02-11 ENCOUNTER — Other Ambulatory Visit: Payer: Self-pay

## 2019-02-11 ENCOUNTER — Encounter: Payer: Self-pay | Admitting: Podiatry

## 2019-02-11 VITALS — Temp 98.1°F

## 2019-02-11 DIAGNOSIS — M21621 Bunionette of right foot: Secondary | ICD-10-CM | POA: Diagnosis not present

## 2019-02-11 DIAGNOSIS — M205X1 Other deformities of toe(s) (acquired), right foot: Secondary | ICD-10-CM | POA: Diagnosis not present

## 2019-02-11 DIAGNOSIS — L859 Epidermal thickening, unspecified: Secondary | ICD-10-CM

## 2019-02-11 MED ORDER — TERBINAFINE HCL 250 MG PO TABS: 250.0000 mg | ORAL_TABLET | Freq: Every day | ORAL | 0 refills | Status: DC

## 2019-02-11 NOTE — Patient Instructions (Addendum)
Take the Lamisil for only 14 days  Terbinafine oral granules What is this medicine? TERBINAFINE (TER bin a feen) is an antifungal medicine. It is used to treat certain kinds of fungal or yeast infections. This medicine may be used for other purposes; ask your health care provider or pharmacist if you have questions. COMMON BRAND NAME(S): Lamisil What should I tell my health care provider before I take this medicine? They need to know if you have any of these conditions: -drink alcoholic beverages -kidney disease -liver disease -an unusual or allergic reaction to Terbinafine, other medicines, foods, dyes, or preservatives -pregnant or trying to get pregnant -breast-feeding How should I use this medicine? Take this medicine by mouth. Follow the directions on the prescription label. Hold packet with cut line on top. Shake packet gently to settle contents. Tear packet open along cut line, or use scissors to cut across line. Carefully pour the entire contents of packet onto a spoonful of a soft food, such as pudding or other soft, non-acidic food such as mashed potatoes (do NOT use applesauce or a fruit-based food). If two packets are required for each dose, you may either sprinkle the content of both packets on one spoonful of non-acidic food, or sprinkle the contents of both packets on two spoonfuls of non-acidic food. Make sure that no granules remain in the packet. Swallow the mxiture of the food and granules without chewing. Take your medicine at regular intervals. Do not take it more often than directed. Take all of your medicine as directed even if you think you are better. Do not skip doses or stop your medicine early. Contact your pediatrician or health care professional regarding the use of this medicine in children. While this medicine may be prescribed for children as young as 4 years for selected conditions, precautions do apply. Overdosage: If you think you have taken too much of this  medicine contact a poison control center or emergency room at once. NOTE: This medicine is only for you. Do not share this medicine with others. What if I miss a dose? If you miss a dose, take it as soon as you can. If it is almost time for your next dose, take only that dose. Do not take double or extra doses. What may interact with this medicine? Do not take this medicine with any of the following medications: -thioridazine This medicine may also interact with the following medications: -beta-blockers -caffeine -cimetidine -cyclosporine -MAOIs like Carbex, Eldepryl, Marplan, Nardil, and Parnate -medicines for fungal infections like fluconazole and ketoconazole -medicines for irregular heartbeat like amiodarone, flecainide and propafenone -rifampin -SSRIs like citalopram, escitalopram, fluoxetine, fluvoxamine, paroxetine and sertraline -tricyclic antidepressants like amitriptyline, clomipramine, desipramine, imipramine, nortriptyline, and others -warfarin This list may not describe all possible interactions. Give your health care provider a list of all the medicines, herbs, non-prescription drugs, or dietary supplements you use. Also tell them if you smoke, drink alcohol, or use illegal drugs. Some items may interact with your medicine. What should I watch for while using this medicine? Your doctor may monitor your liver function. Tell your doctor right away if you have nausea or vomiting, loss of appetite, stomach pain on your right upper side, yellow skin, dark urine, light stools, or are over tired. You need to take this medicine for 6 weeks or longer to cure the fungal infection. Take your medicine regularly for as long as your doctor or health care professional tells you to. What side effects may I notice from receiving  this medicine? Side effects that you should report to your doctor or health care professional as soon as possible: -allergic reactions like skin rash or hives, swelling  of the face, lips, or tongue -change in vision -dark urine -fever or infection -general ill feeling or flu-like symptoms -light-colored stools -loss of appetite, nausea -redness, blistering, peeling or loosening of the skin, including inside the mouth -right upper belly pain -unusually weak or tired -yellowing of the eyes or skin Side effects that usually do not require medical attention (report to your doctor or health care professional if they continue or are bothersome): -changes in taste -diarrhea -hair loss -muscle or joint pain -stomach upset This list may not describe all possible side effects. Call your doctor for medical advice about side effects. You may report side effects to FDA at 1-800-FDA-1088. Where should I keep my medicine? Keep out of the reach of children. Store at room temperature between 15 and 30 degrees C (59 and 86 degrees F). Throw away any unused medicine after the expiration date. NOTE: This sheet is a summary. It may not cover all possible information. If you have questions about this medicine, talk to your doctor, pharmacist, or health care provider.  2019 Elsevier/Gold Standard (2007-11-30 17:25:48)     Pre-Operative Instructions  Congratulations, you have decided to take an important step towards improving your quality of life.  You can be assured that the doctors and staff at [facility] will be with you every step of the way.  Here are some important things you should know:  1. Plan to be at the surgery center/hospital at least 1 (one) hour prior to your scheduled time, unless otherwise directed by the surgical center/hospital staff.  You must have a responsible adult accompany you, remain during the surgery and drive you home.  Make sure you have directions to the surgical center/hospital to ensure you arrive on time. 2. If you are having surgery at [facility] or [facility], you will need a copy of your medical history and physical  form from your family physician within one month prior to the date of surgery. We will give you a form for your primary physician to complete.  3. We make every effort to accommodate the date you request for surgery.  However, there are times where surgery dates or times have to be moved.  We will contact you as soon as possible if a change in schedule is required.   4. No aspirin/ibuprofen for one week before surgery.  If you are on aspirin, any non-steroidal anti-inflammatory medications (Mobic, Aleve, Ibuprofen) should not be taken seven (7) days prior to your surgery.  You make take Tylenol for pain prior to surgery.  5. Medications - If you are taking daily heart and blood pressure medications, seizure, reflux, allergy, asthma, anxiety, pain or diabetes medications, make sure you notify the surgery center/hospital before the day of surgery so they can tell you which medications you should take or avoid the day of surgery. 6. No food or drink after midnight the night before surgery unless directed otherwise by surgical center/hospital staff. 7. No alcoholic beverages 24-hours prior to surgery.  No smoking 24-hours prior or 24-hours after surgery. 8. Wear loose pants or shorts. They should be loose enough to fit over bandages, boots, and casts. 9. Don't wear slip-on shoes. Sneakers are preferred. 10. Bring your boot with you to the surgery center/hospital.  Also bring crutches or a walker if your physician has  prescribed it for you.  If you do not have this equipment, it will be provided for you after surgery. 11. If you have not been contacted by the surgery center/hospital by the day before your surgery, call to confirm the date and time of your surgery. 12. Leave-time from work may vary depending on the type of surgery you have.  Appropriate arrangements should be made prior to surgery with your employer. 13. Prescriptions will be provided immediately following surgery by your doctor.  Fill these as  soon as possible after surgery and take the medication as directed. Pain medications will not be refilled on weekends and must be approved by the doctor. 14. Remove nail polish on the operative foot and avoid getting pedicures prior to surgery. 15. Wash the night before surgery.  The night before surgery wash the foot and leg well with water and the antibacterial soap provided. Be sure to pay special attention to beneath the toenails and in between the toes.  Wash for at least three (3) minutes. Rinse thoroughly with water and dry well with a towel.  Perform this wash unless told not to do so by your physician.  Enclosed: 1 Ice pack (please put in freezer the night before surgery)   1 Hibiclens skin cleaner   Pre-op instructions  If you have any questions regarding the instructions, please do not hesitate to call our office.  [place]: [address], [place], [place] [address] -- [phone]  [place]: [address] [place], [place] [address] -- [phone]  [place]: [address]  [place], [place] [address] -- [phone]  [place]: [address], [address], [place], [place] [address] -- [phone]  Website: [email or web address]

## 2019-02-13 NOTE — Progress Notes (Signed)
Subjective: Tanya Stone presents to the office today for surgical consultation due to a painful tailors bunion on the right foot. She states that it is hurting and she wants to proceed with surgery but she only wants me to shave the bump down. She also gets thick skin in between the 4th and 5th toes as well. This has been ongoing. She states her feet are doing much better otherwise. She has pain along the tailors bunion on a daily basis despite shoe changes, offloading, injection.  Denies any systemic complaints such as fevers, chills, nausea, vomiting. No acute changes since last appointment, and no other complaints at this time.   Objective: AAO x3, NAD DP/PT pulses palpable bilaterally, CRT less than 3 seconds Moderate to large bony deformities present and there is tenderness directly upon palpation of the lateral fifth metatarsal head.  Localized edema there is no erythema or warmth.  Is no fluid collection present.  No signs of infection.  Adductovarus is present in the fourth and fifth toes and along the fourth interspace there is hyperkeratotic tissue buildup.  Upon debridement no drainage or pus or any signs of infection. No other open lesions or pre-ulcerative lesions.  No pain with calf compression, swelling, warmth, erythema  Assessment: 43 year old female with symptomatic tailor's bunion, interdigital hyperkeratosis   Plan: -All treatment options discussed with the patient including all alternatives, risks, complications.  -I reviewed the x-rays with her and we discussed both conservative as well as surgical care.  This time she wished to proceed with surgical intervention.  Discussed with her options for tailor's bunion but she only was proceed with shaving of the bone.  Discussed with exostectomy lateral fifth metatarsal head.  Given interdigital changes medially.  Discussed with her arthroplasty the fifth PIPJ treatment today.  She was proceed with that as well. -We will plan for  exostectomy/tumors bunionectomy right foot with repair of the fifth digit. -The incision placement as well as the postoperative course was discussed with the patient. I discussed risks of the surgery which include, but not limited to, infection, bleeding, pain, swelling, need for further surgery, delayed or nonhealing, painful or ugly scar, numbness or sensation changes, over/under correction, recurrence, transfer lesions, further deformity, hardware failure, DVT/PE, loss of toe/foot. Patient understands these risks and wishes to proceed with surgery. The surgical consent was reviewed with the patient all 3 pages were signed. No promises or guarantees were given to the outcome of the procedure. All questions were answered to the best of my ability. Before the surgery the patient was encouraged to call the office if there is any further questions. The surgery will be performed at the [facility] on an outpatient basis.  Vivi Barrack DPM

## 2019-03-27 ENCOUNTER — Encounter: Payer: Self-pay | Admitting: Podiatry

## 2019-03-27 ENCOUNTER — Other Ambulatory Visit: Payer: Self-pay | Admitting: Podiatry

## 2019-03-27 DIAGNOSIS — M2011 Hallux valgus (acquired), right foot: Secondary | ICD-10-CM | POA: Diagnosis not present

## 2019-03-27 DIAGNOSIS — M2041 Other hammer toe(s) (acquired), right foot: Secondary | ICD-10-CM

## 2019-03-27 MED ORDER — PROMETHAZINE HCL 25 MG PO TABS: 25.0000 mg | ORAL_TABLET | Freq: Three times a day (TID) | ORAL | 0 refills | Status: DC | PRN

## 2019-03-27 MED ORDER — HYDROCODONE-ACETAMINOPHEN 5-325 MG PO TABS: 1.0000 | ORAL_TABLET | Freq: Four times a day (QID) | ORAL | 0 refills | Status: DC | PRN

## 2019-03-27 MED ORDER — CEPHALEXIN 500 MG PO CAPS: 500.0000 mg | ORAL_CAPSULE | Freq: Three times a day (TID) | ORAL | 0 refills | Status: DC

## 2019-03-27 NOTE — Progress Notes (Signed)
Post op medications sent to pharmacy 

## 2019-03-28 ENCOUNTER — Telehealth: Payer: Self-pay | Admitting: *Deleted

## 2019-03-28 NOTE — Telephone Encounter (Signed)
Called and left a message for the patient to call the [place] office at [phone] checking on patient after having surgery with Dr Jacqualyn Posey Wednesday. Lattie Haw

## 2019-04-01 ENCOUNTER — Encounter: Payer: Self-pay | Admitting: Podiatry

## 2019-04-01 ENCOUNTER — Ambulatory Visit (INDEPENDENT_AMBULATORY_CARE_PROVIDER_SITE_OTHER): Payer: Medicaid Other | Admitting: Podiatry

## 2019-04-01 ENCOUNTER — Other Ambulatory Visit: Payer: Self-pay

## 2019-04-01 ENCOUNTER — Ambulatory Visit (INDEPENDENT_AMBULATORY_CARE_PROVIDER_SITE_OTHER): Payer: Medicaid Other

## 2019-04-01 VITALS — Temp 97.9°F

## 2019-04-01 DIAGNOSIS — M21621 Bunionette of right foot: Secondary | ICD-10-CM | POA: Diagnosis not present

## 2019-04-01 DIAGNOSIS — Z09 Encounter for follow-up examination after completed treatment for conditions other than malignant neoplasm: Secondary | ICD-10-CM

## 2019-04-01 MED ORDER — IBUPROFEN 800 MG PO TABS: 800.0000 mg | ORAL_TABLET | Freq: Three times a day (TID) | ORAL | 0 refills | Status: DC | PRN

## 2019-04-01 NOTE — Progress Notes (Signed)
Subjective: Tanya Stone is a 43 y.o. is seen today in office s/p right foot tailor's bunionectomy, fifth digit PIPJ arthroplasty preformed on 03/27/2019.  Overall she states that she is doing well.  Is only taking pain medication as needed for taking ibuprofen.  She states that her pain has not been severe.  She has been in a cam boot.  Denies any systemic complaints such as fevers, chills, nausea, vomiting. No calf pain, chest pain, shortness of breath.   Objective: General: No acute distress, AAOx3  DP/PT pulses palpable 2/4, CRT < 3 sec to all digits.  Protective sensation intact. Motor function intact.  Right foot: Incision is well coapted without any evidence of dehiscence with sutures intact. There is no surrounding erythema, ascending cellulitis, fluctuance, crepitus, malodor, drainage/purulence. There is mild edema around the surgical site. There is minimal pain along the surgical site.  No other areas of tenderness to bilateral lower extremities.  No other open lesions or pre-ulcerative lesions.  No pain with calf compression, swelling, warmth, erythema.   Assessment and Plan:  Status post right foot surgery, doing well with no complications   -Treatment options discussed including all alternatives, risks, and complications -X-rays were obtained and reviewed.  Status post shave bunionectomy of the fifth metatarsal head as well as PIPJ arthroplasty.  No evidence of acute fracture. -Incisions healing well.  Antibiotic ointment and a bandage was applied.  Keep the dressing clean, dry, intact. -Remain in cam boot. -Ice/elevation -Pain medication as needed. -Monitor for any clinical signs or symptoms of infection and DVT/PE and directed to call the office immediately should any occur or go to the ER. -Follow-up as scheduled for suture removal or sooner if any problems arise. In the meantime, encouraged to call the office with any questions, concerns, change in symptoms.   Celesta Gentile, DPM

## 2019-04-18 ENCOUNTER — Ambulatory Visit (INDEPENDENT_AMBULATORY_CARE_PROVIDER_SITE_OTHER): Payer: Medicaid Other | Admitting: Podiatry

## 2019-04-18 ENCOUNTER — Other Ambulatory Visit: Payer: Self-pay

## 2019-04-18 DIAGNOSIS — Z09 Encounter for follow-up examination after completed treatment for conditions other than malignant neoplasm: Secondary | ICD-10-CM

## 2019-04-18 DIAGNOSIS — M205X1 Other deformities of toe(s) (acquired), right foot: Secondary | ICD-10-CM

## 2019-04-18 DIAGNOSIS — M21621 Bunionette of right foot: Secondary | ICD-10-CM

## 2019-04-29 NOTE — Progress Notes (Signed)
Subjective: Tanya Stone is a 43 y.o. is seen today in office s/p right foot tailor's bunionectomy, fifth digit PIPJ arthroplasty preformed on 03/27/2019. She states that she is doing well. She has some itching but no pain. She has been wearing a surgical boot. Denies any systemic complaints such as fevers, chills, nausea, vomiting. No calf pain, chest pain, shortness of breath.   Objective: General: No acute distress, AAOx3  DP/PT pulses palpable 2/4, CRT < 3 sec to all digits.  Protective sensation intact. Motor function intact.  Right foot: Incision is well coapted without any evidence of dehiscence and a scar has formed.  There is no surrounding erythema, ascending cellulitis, fluctuance, crepitus, malodor, drainage/purulence. There is decreased edema around the surgical site. There is no pain along the surgical site.  No other areas of tenderness to bilateral lower extremities.  No other open lesions or pre-ulcerative lesions.  No pain with calf compression, swelling, warmth, erythema.   Assessment and Plan:  Status post right foot surgery, doing well with no complications   -Treatment options discussed including all alternatives, risks, and complications -Overall she is doing well.  I want her to remain on surgical offloading boot.  Continue antibiotic ointment dressing changes daily.  However as she starts to feel better mild swelling improves she can try to get back into a regular sneaker but gradually transition.  Continue ice and elevate. -Monitor for any clinical signs or symptoms of infection and DVT/PE and directed to call the office immediately should any occur or go to the ER. -Follow-up as scheduled for suture removal or sooner if any problems arise. In the meantime, encouraged to call the office with any questions, concerns, change in symptoms.   Celesta Gentile, DPM

## 2019-04-30 ENCOUNTER — Other Ambulatory Visit: Payer: Medicaid Other

## 2019-05-07 ENCOUNTER — Other Ambulatory Visit: Payer: Self-pay

## 2019-05-07 ENCOUNTER — Ambulatory Visit (INDEPENDENT_AMBULATORY_CARE_PROVIDER_SITE_OTHER): Payer: Self-pay | Admitting: Podiatry

## 2019-05-07 ENCOUNTER — Ambulatory Visit (INDEPENDENT_AMBULATORY_CARE_PROVIDER_SITE_OTHER): Payer: Medicaid Other

## 2019-05-07 DIAGNOSIS — M21621 Bunionette of right foot: Secondary | ICD-10-CM

## 2019-05-07 DIAGNOSIS — Z09 Encounter for follow-up examination after completed treatment for conditions other than malignant neoplasm: Secondary | ICD-10-CM

## 2019-05-07 DIAGNOSIS — M216X1 Other acquired deformities of right foot: Secondary | ICD-10-CM

## 2019-05-07 MED ORDER — DOXYCYCLINE HYCLATE 100 MG PO TABS: 100.0000 mg | ORAL_TABLET | Freq: Two times a day (BID) | ORAL | 0 refills | Status: DC

## 2019-05-07 MED ORDER — IBUPROFEN 800 MG PO TABS: 800.0000 mg | ORAL_TABLET | Freq: Three times a day (TID) | ORAL | 0 refills | Status: DC | PRN

## 2019-05-07 NOTE — Patient Instructions (Signed)
Keep the foot elevated and ice your foot about 20 min 3 times a day.  Start ibuprofen I sent an antibiotic (doxycyline) the pharmacy for you as a precaution

## 2019-05-16 ENCOUNTER — Encounter: Payer: Medicaid Other | Admitting: Podiatry

## 2019-05-16 NOTE — Progress Notes (Signed)
Subjective: Tanya Stone is a 43 y.o. is seen today in office s/p right foot tailor's bunionectomy, fifth digit PIPJ arthroplasty preformed on 03/27/2019.  Overall she said that she is doing okay.  She is getting some throbbing and some tenderness on the lateral aspect of foot mostly left fifth toe.  She says it hurts to put blanket touches it at night at times.  She denies any redness or drainage.  Denies any systemic complaints such as fevers, chills, nausea, vomiting. No calf pain, chest pain, shortness of breath.   Objective: General: No acute distress, AAOx3  DP/PT pulses palpable 2/4, CRT < 3 sec to all digits.  Protective sensation intact. Motor function intact.  Right foot: Incision is well coapted without any evidence of dehiscence and a scar has formed.  There is mild edema at the surgical site but there is no significant erythema or warmth.  There is no drainage or pus from the incision there is no fluctuation crepitation any malodor.  There is tenderness palpation at surgical site. No other areas of tenderness to bilateral lower extremities.  No other open lesions or pre-ulcerative lesions.  No pain with calf compression, swelling, warmth, erythema.   Assessment and Plan:  Status post right foot surgery  -Treatment options discussed including all alternatives, risks, and complications -X-rays obtained reviewed.  There is remodeling to the digits on surgical site but no obvious signs of osteomyelitis or fracture. -She does have some increased tenderness to the area compared to last appointment. -As a precaution I did order Keflex in case of infection although clinically does not appear to be infected.  Refill pain medication.  Continue with surgical shoe.  Continue ice elevation.  Compression. -Monitor for any clinical signs or symptoms of infection and DVT/PE and directed to call the office immediately should any occur or go to the ER. -Follow-up as scheduled for suture removal or  sooner if any problems arise. In the meantime, encouraged to call the office with any questions, concerns, change in symptoms.   Celesta Gentile, DPM

## 2019-05-23 ENCOUNTER — Encounter: Payer: Medicaid Other | Admitting: Podiatry

## 2019-05-27 ENCOUNTER — Encounter: Payer: Self-pay | Admitting: Podiatry

## 2019-05-27 ENCOUNTER — Other Ambulatory Visit: Payer: Self-pay

## 2019-05-27 ENCOUNTER — Ambulatory Visit (INDEPENDENT_AMBULATORY_CARE_PROVIDER_SITE_OTHER): Payer: Self-pay | Admitting: Podiatry

## 2019-05-27 ENCOUNTER — Ambulatory Visit (INDEPENDENT_AMBULATORY_CARE_PROVIDER_SITE_OTHER): Payer: Medicaid Other

## 2019-05-27 VITALS — Temp 97.8°F

## 2019-05-27 DIAGNOSIS — Z09 Encounter for follow-up examination after completed treatment for conditions other than malignant neoplasm: Secondary | ICD-10-CM

## 2019-05-27 DIAGNOSIS — M21621 Bunionette of right foot: Secondary | ICD-10-CM

## 2019-05-27 DIAGNOSIS — M216X1 Other acquired deformities of right foot: Secondary | ICD-10-CM

## 2019-05-29 NOTE — Progress Notes (Signed)
Subjective: Tanya Stone is a 43 y.o. is seen today in office s/p right foot tailor's bunionectomy, fifth digit PIPJ arthroplasty preformed on 03/27/2019.  She states that she is doing much better the pain is much improved.  Still has some numbness but fifth toe but is improving as well.  Denies any increase in swelling Swelling is improved.  No redness or warmth.  She finished antibiotics.  Denies any systemic complaints such as fevers, chills, nausea, vomiting. No calf pain, chest pain, shortness of breath.   Objective: General: No acute distress, AAOx3  DP/PT pulses palpable 2/4, CRT < 3 sec to all digits.  Protective sensation intact. Motor function intact.  Right foot: Incision is well coapted without any evidence of dehiscence and a scar has formed.  There is decreased edema to the area there is no significant erythema or warmth.  No ascending cellulitis.  No fluctuation crepitation.  She had some subjective numbness of the fifth toe but she states this is improving. No other open lesions or pre-ulcerative lesions.  No pain with calf compression, swelling, warmth, erythema.   Assessment and Plan:  Status post right foot surgery  -Treatment options discussed including all alternatives, risks, and complications -X-rays obtained reviewed.  There is remodeling to the digits on surgical site but no obvious signs of osteomyelitis or fracture.  Appears to be unchanged. -Overall she is doing better.  I want her to gradually get back to regular shoe.  She was returned to work.  Note was provided to return to work next week. -Continue to ice elevate. -Gradual increase to activity level. -Monitor for any clinical signs or symptoms of infection and directed to call the office immediately should any occur or go to the ER.  Return in about 4 weeks (around 06/24/2019), or if symptoms worsen or fail to improve.  Trula Slade DPM

## 2019-06-24 ENCOUNTER — Ambulatory Visit: Payer: Medicaid Other

## 2019-06-24 ENCOUNTER — Encounter: Payer: Medicaid Other | Admitting: Podiatry

## 2019-06-24 DIAGNOSIS — M21621 Bunionette of right foot: Secondary | ICD-10-CM

## 2019-06-24 DIAGNOSIS — M2041 Other hammer toe(s) (acquired), right foot: Secondary | ICD-10-CM

## 2019-06-24 NOTE — Progress Notes (Signed)
No show  This encounter was created in error - please disregard.

## 2019-08-20 ENCOUNTER — Other Ambulatory Visit: Payer: Self-pay | Admitting: Obstetrics

## 2019-08-20 DIAGNOSIS — Z1231 Encounter for screening mammogram for malignant neoplasm of breast: Secondary | ICD-10-CM

## 2019-10-15 ENCOUNTER — Ambulatory Visit
Admission: RE | Admit: 2019-10-15 | Discharge: 2019-10-15 | Disposition: A | Discharge status: Home / Self Care | Payer: Medicaid Other | Source: Ambulatory Visit | Attending: Obstetrics | Admitting: Obstetrics

## 2019-10-15 ENCOUNTER — Other Ambulatory Visit: Payer: Self-pay

## 2019-10-15 DIAGNOSIS — Z1231 Encounter for screening mammogram for malignant neoplasm of breast: Secondary | ICD-10-CM

## 2019-11-14 ENCOUNTER — Other Ambulatory Visit: Payer: Self-pay | Admitting: *Deleted

## 2019-11-14 DIAGNOSIS — B379 Candidiasis, unspecified: Secondary | ICD-10-CM

## 2019-11-14 DIAGNOSIS — T3695XA Adverse effect of unspecified systemic antibiotic, initial encounter: Secondary | ICD-10-CM

## 2019-11-14 MED ORDER — FLUCONAZOLE 150 MG PO TABS: 150.0000 mg | ORAL_TABLET | Freq: Once | ORAL | 0 refills | Status: AC

## 2019-11-14 NOTE — Progress Notes (Signed)
Pt states she has been on antibiotics and now has yeast infection. Pt advised to complete antibiotics and then take tx for yeast.  Diflucan was sent in today per protocol.

## 2020-03-21 IMAGING — US US ABDOMEN COMPLETE
1 series · 14 of 25 positions shown · non-contrast
Comparison: Abdominopelvic CT scan June 21, 2011

CLINICAL DATA: Rights flank pain. Suspect urinary tract obstruction
or gallstones.

EXAM:
ABDOMEN ULTRASOUND COMPLETE

[Series 1: us abdomen complete · 14 of 86 slices shown]
[im 1/86]
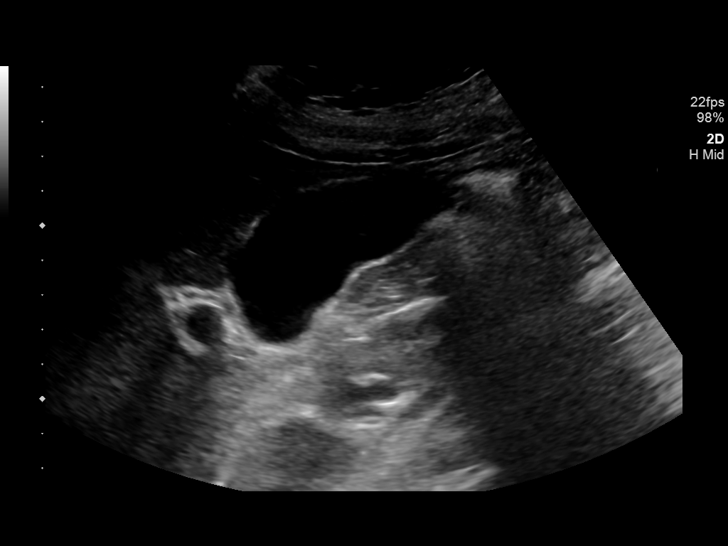
[im 8/86]
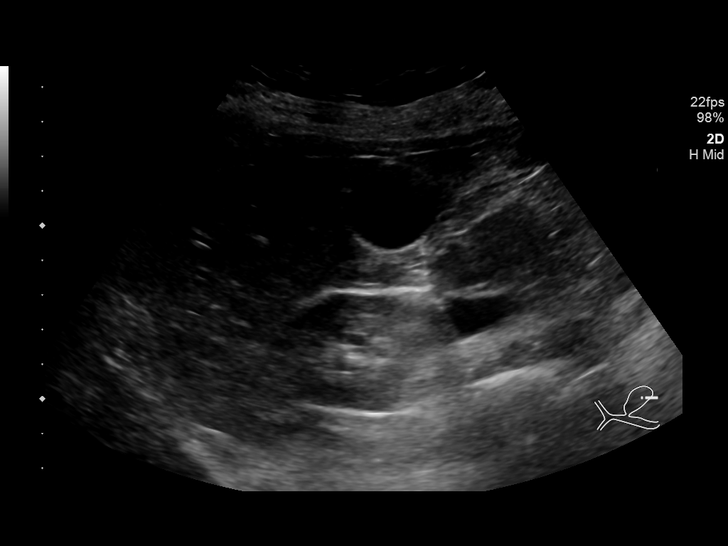
[im 15/86]
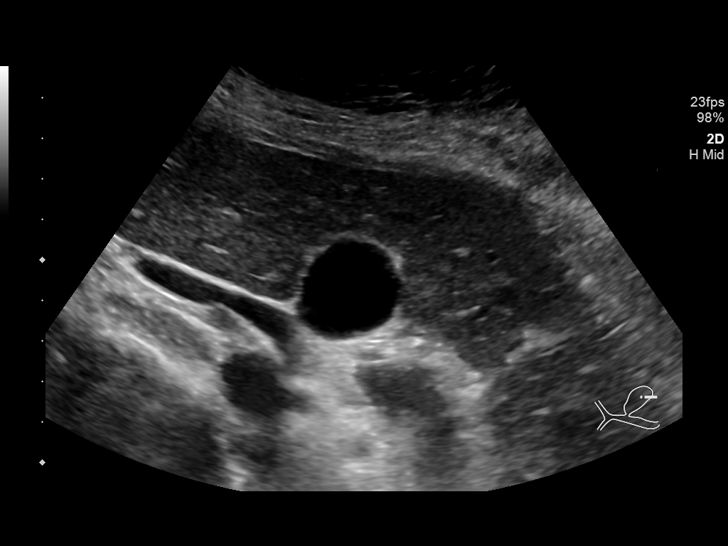
[im 22/86]
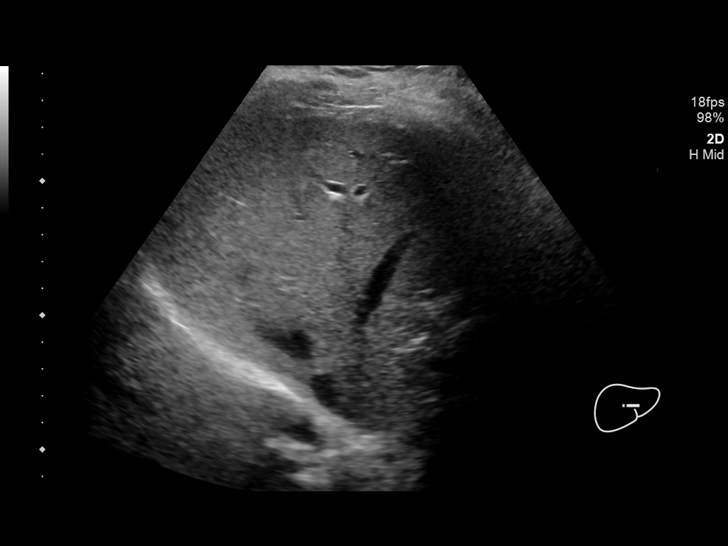
[im 29/86]
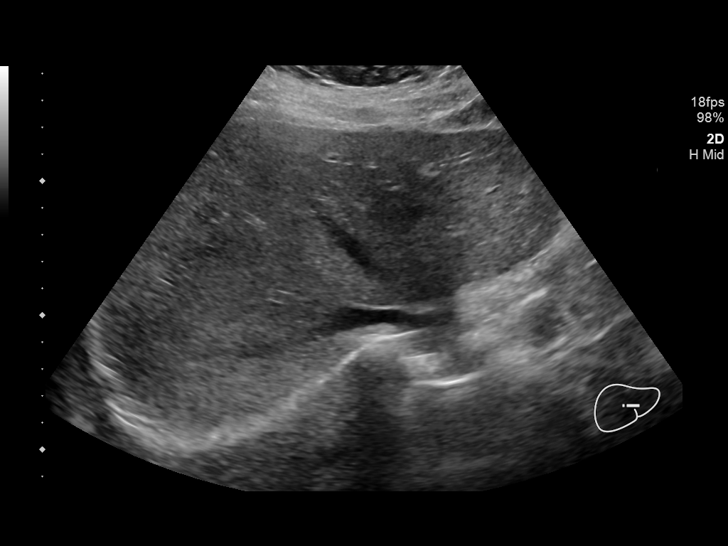
[im 32/86]
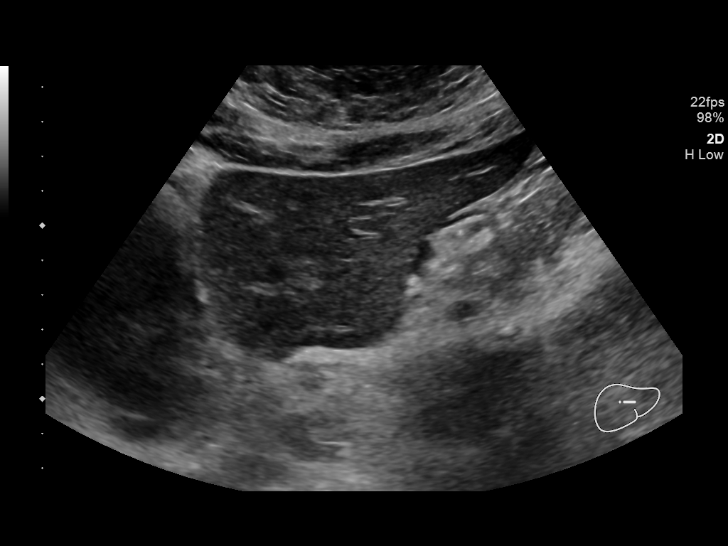
[im 39/86]
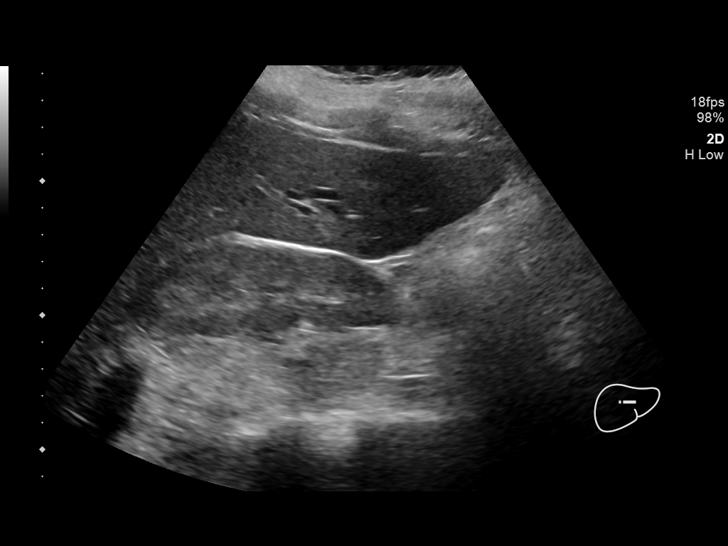
[im 47/86]
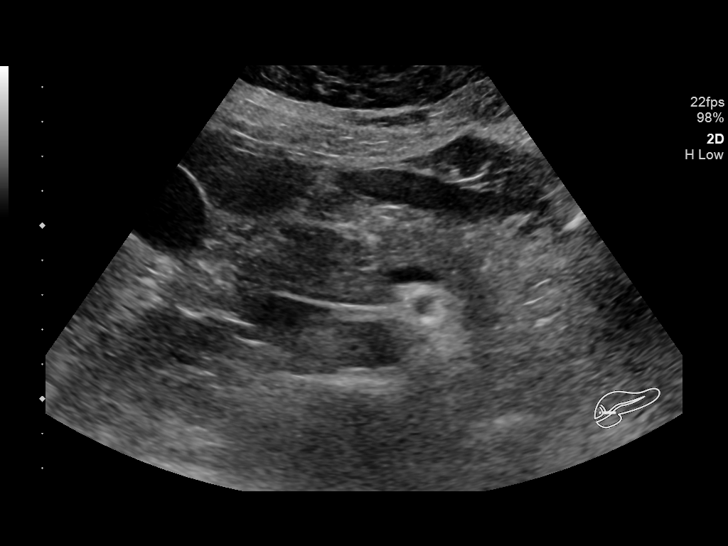
[im 54/86]
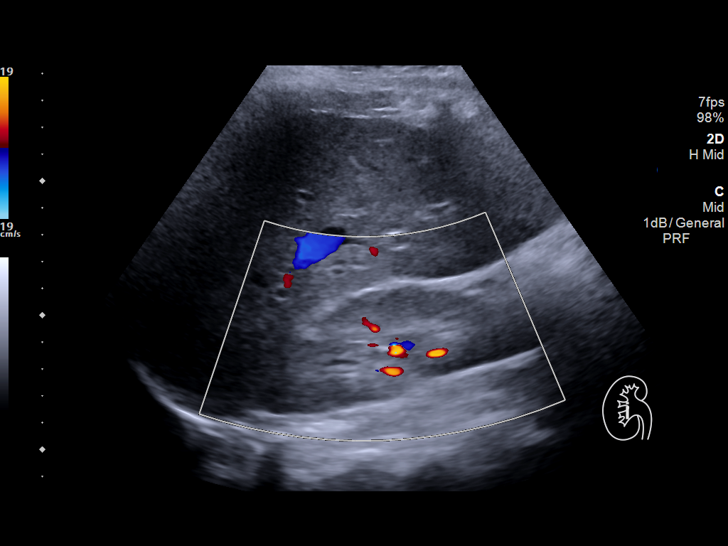
[im 57/86]
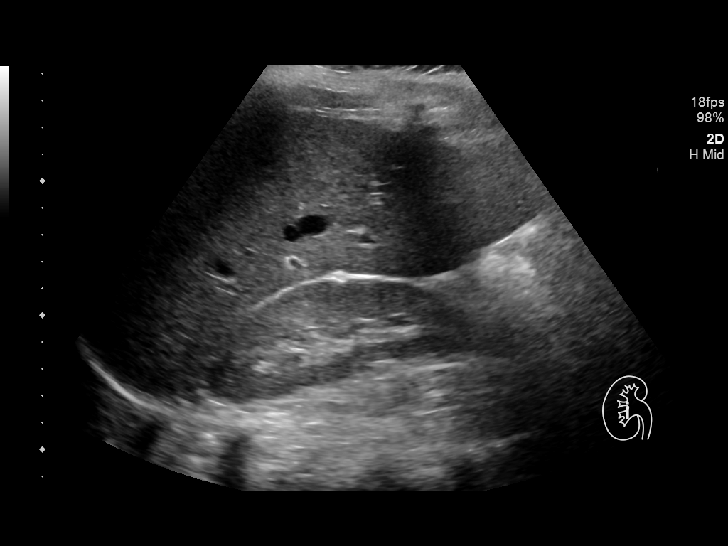
[im 64/86]
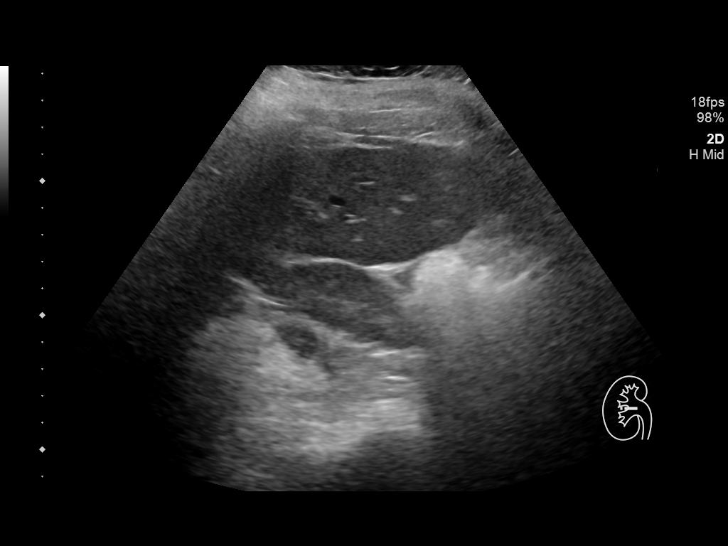
[im 71/86]
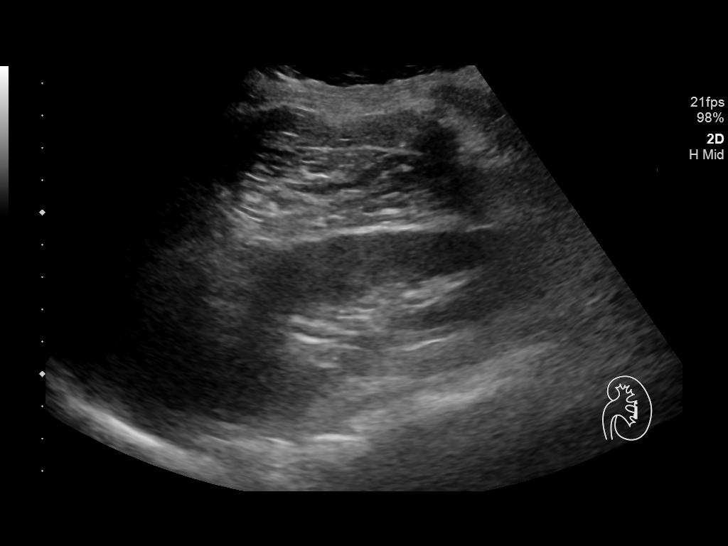
[im 78/86]
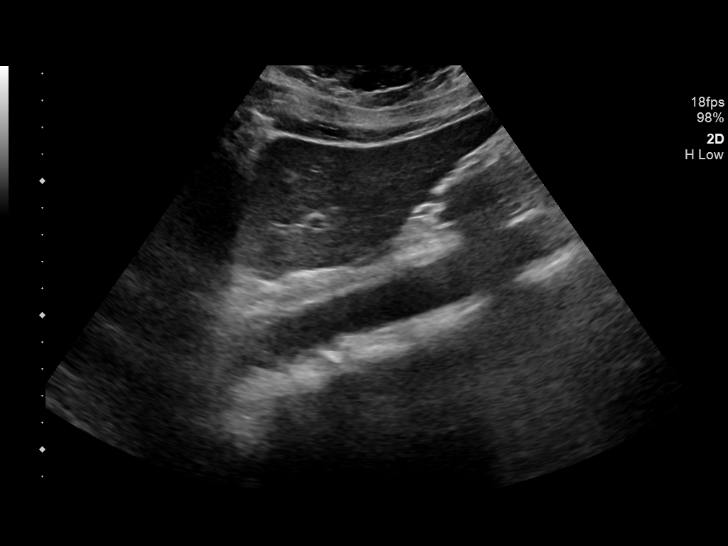
[im 86/86]
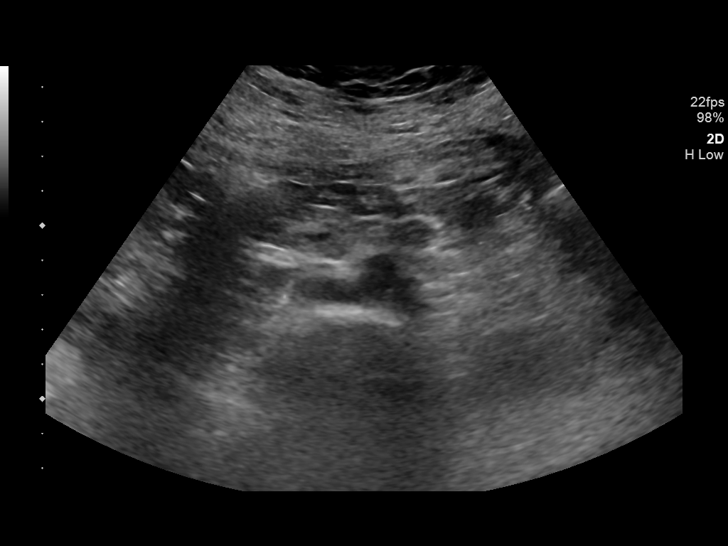

[14 of 25 positions shown; findings below may reference images not displayed]

FINDINGS: Gallbladder: No gallstones or wall thickening visualized. No
sonographic Murphy sign noted by sonographer.

Common bile duct: Diameter: 4 mm

Liver: No focal lesion identified. Within normal limits in
parenchymal echogenicity. Portal vein is patent on color Doppler
imaging with normal direction of blood flow towards the liver.

IVC: No abnormality visualized.

Pancreas: Visualized portion unremarkable.

Spleen: Size and appearance within normal limits.

Right Kidney: Length: 10.9 cm. Echogenicity within normal limits. No
mass or hydronephrosis visualized.

Left Kidney: Length: 11.9 cm. Echogenicity within normal limits. No
mass or hydronephrosis visualized.

Abdominal aorta: No aneurysm visualized.

Other findings: There is no ascites.
IMPRESSION: No gallstones or sonographic evidence of acute cholecystitis.

No calcified kidney stones nor hydronephrosis.

No acute intra-abdominal abnormality is observed.

## 2020-05-19 IMAGING — CR DG SHOULDER 2+V*R*
3 series · 3 of 3 positions shown · non-contrast
Comparison: Chest radiograph 09/24/2017

CLINICAL DATA: 41-year-old with right neck and shoulder pain for 5
days.

EXAM:
RIGHT SHOULDER - 2+ VIEW

[w shoulder external right]
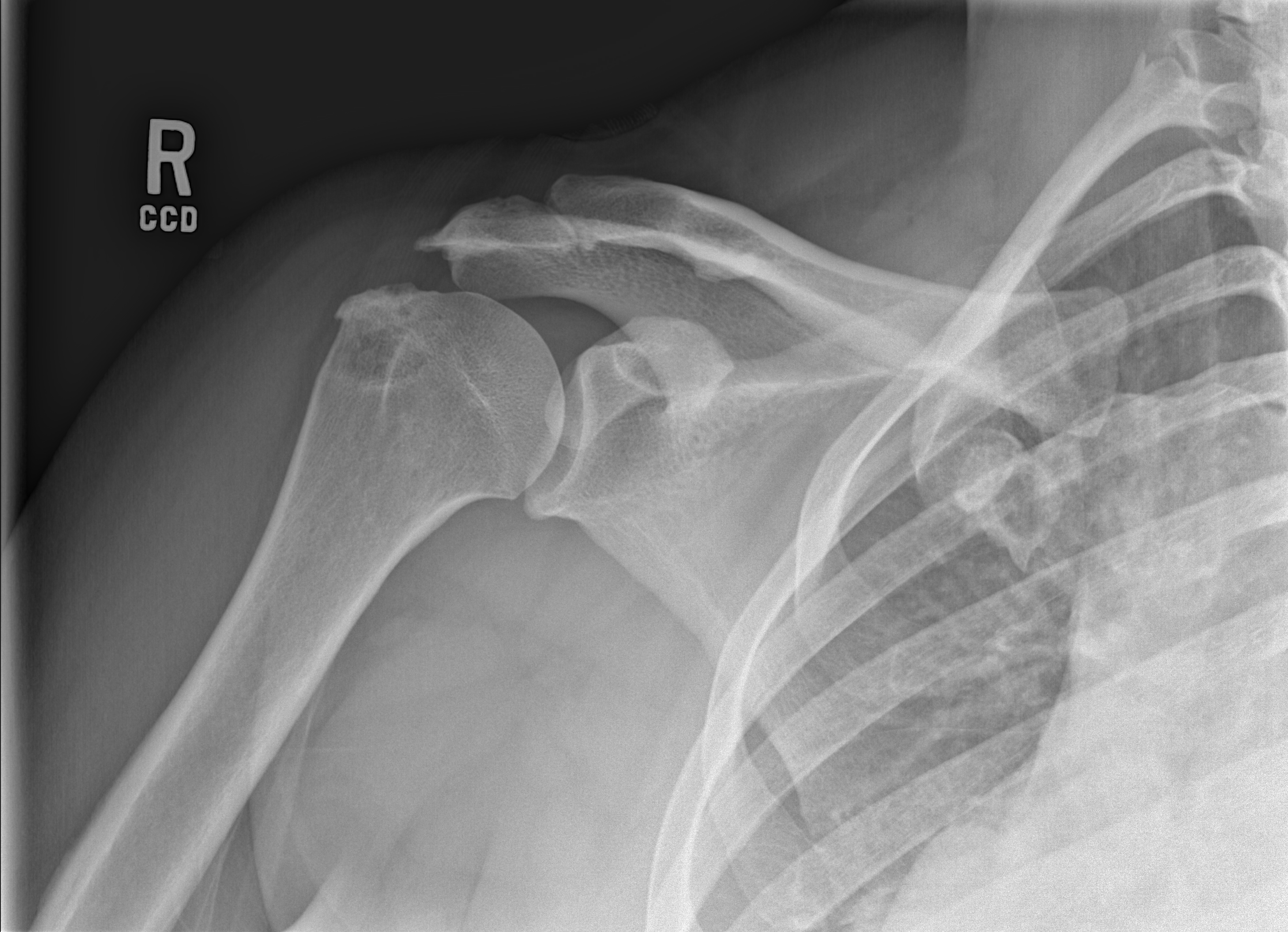

[w shoulder y-view right]
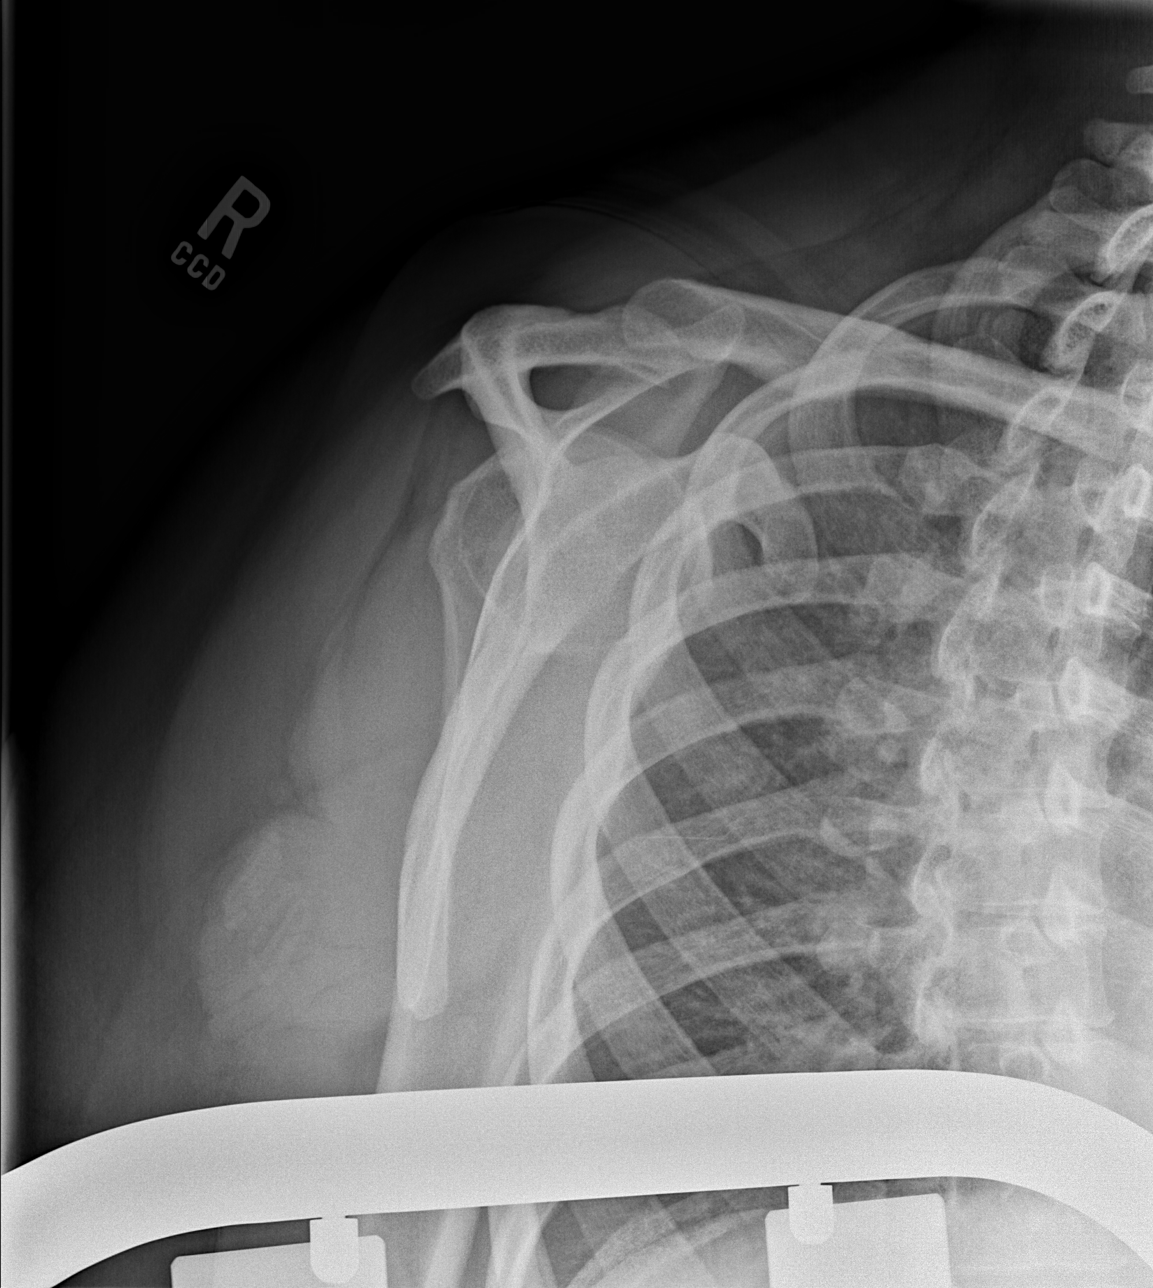

[x shoulder axillary right]
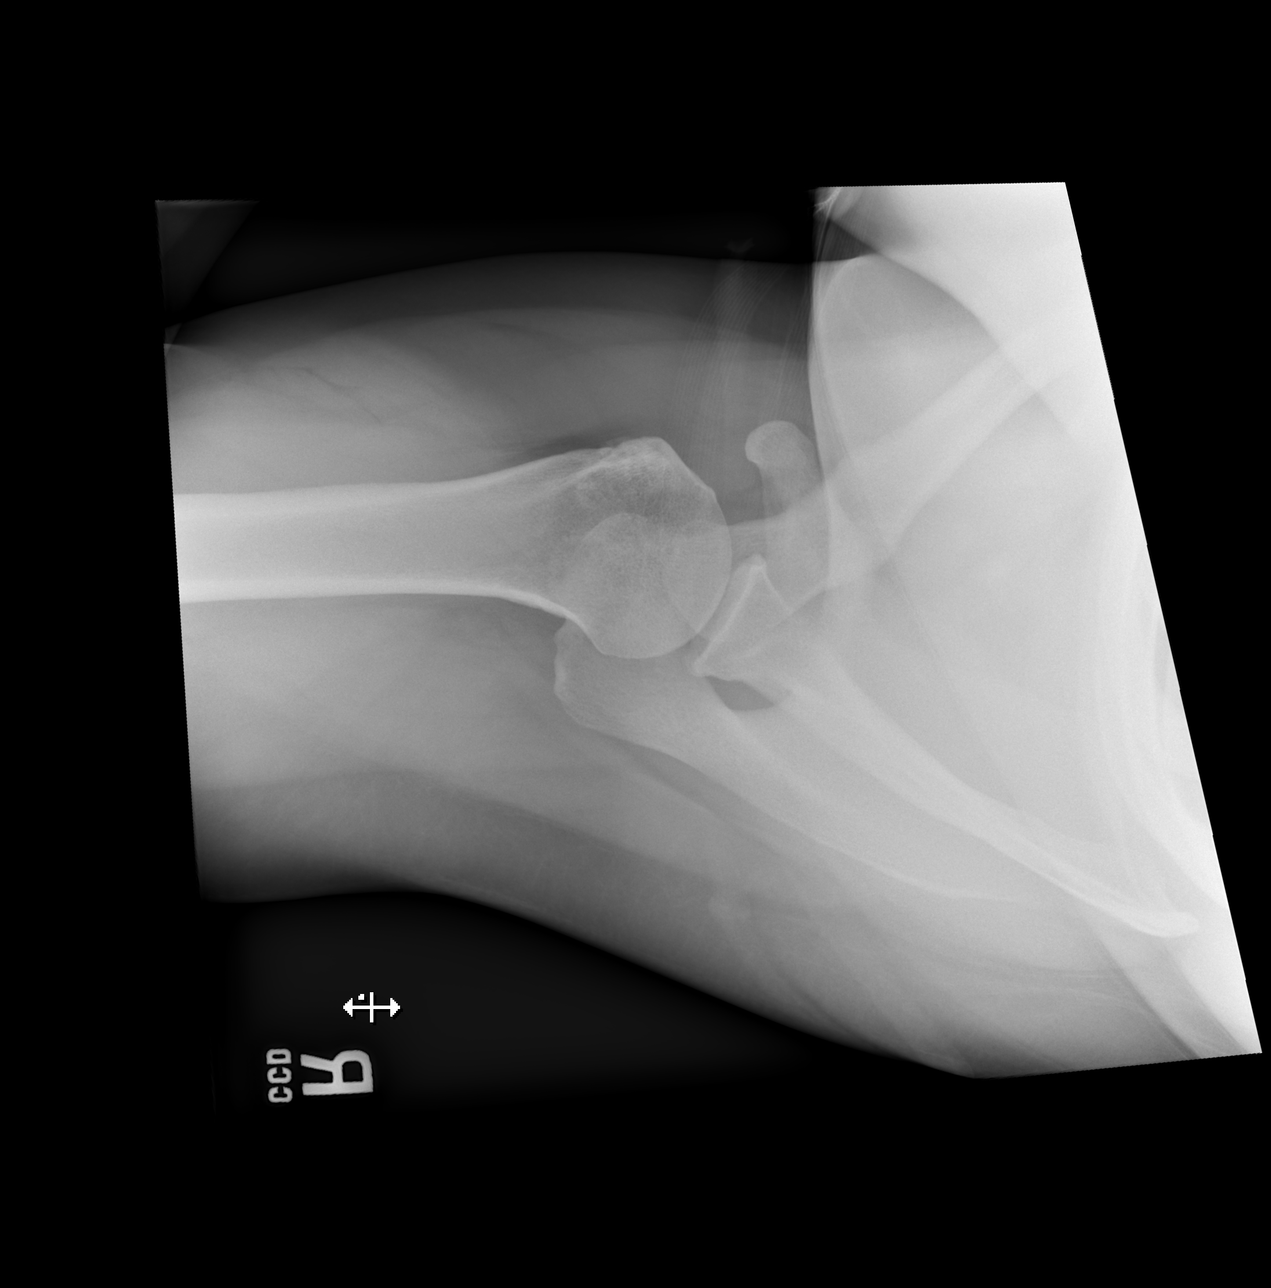

[3 of 3 positions shown; findings below may reference images not displayed]

FINDINGS: Sclerosis and spurring along the undersurface of the acromion.
Normal alignment at the right AC joint. Right shoulder is located
without fracture. Mild irregularity of the greater tubercle is
suggestive for chronic changes.
IMPRESSION: No acute bone abnormality to the right shoulder.

Degenerative changes at the right AC joint and along the
undersurface of the acromion.

## 2020-12-02 ENCOUNTER — Other Ambulatory Visit: Payer: Self-pay | Admitting: Obstetrics

## 2020-12-02 DIAGNOSIS — Z1231 Encounter for screening mammogram for malignant neoplasm of breast: Secondary | ICD-10-CM
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
## 2021-01-25 ENCOUNTER — Inpatient Hospital Stay: Admission: RE | Admit: 2021-01-25 | Payer: Medicaid Other | Source: Ambulatory Visit

## 2021-03-15 ENCOUNTER — Ambulatory Visit
Admission: RE | Admit: 2021-03-15 | Discharge: 2021-03-15 | Disposition: A | Discharge status: Home / Self Care | Payer: Medicaid Other | Source: Ambulatory Visit | Attending: Obstetrics | Admitting: Obstetrics

## 2021-03-15 ENCOUNTER — Other Ambulatory Visit: Payer: Self-pay

## 2021-03-15 DIAGNOSIS — Z1231 Encounter for screening mammogram for malignant neoplasm of breast: Secondary | ICD-10-CM

## 2021-09-27 ENCOUNTER — Encounter (HOSPITAL_COMMUNITY): Payer: Self-pay | Admitting: Radiology

## 2021-09-27 ENCOUNTER — Emergency Department (HOSPITAL_COMMUNITY): Payer: Medicaid Other

## 2021-09-27 ENCOUNTER — Emergency Department (HOSPITAL_COMMUNITY)
Admission: EM | Admit: 2021-09-27 | Discharge: 2021-09-27 | Disposition: A | Discharge status: Home / Self Care | Payer: Medicaid Other | Attending: Emergency Medicine | Admitting: Emergency Medicine

## 2021-09-27 DIAGNOSIS — Z87891 Personal history of nicotine dependence: Secondary | ICD-10-CM | POA: Insufficient documentation

## 2021-09-27 DIAGNOSIS — R109 Unspecified abdominal pain: Secondary | ICD-10-CM

## 2021-09-27 DIAGNOSIS — Z79899 Other long term (current) drug therapy: Secondary | ICD-10-CM | POA: Insufficient documentation

## 2021-09-27 DIAGNOSIS — Z20822 Contact with and (suspected) exposure to covid-19: Secondary | ICD-10-CM | POA: Insufficient documentation

## 2021-09-27 DIAGNOSIS — R10A0 Flank pain, unspecified side: Secondary | ICD-10-CM

## 2021-09-27 DIAGNOSIS — R197 Diarrhea, unspecified: Secondary | ICD-10-CM

## 2021-09-27 DIAGNOSIS — R1031 Right lower quadrant pain: Secondary | ICD-10-CM | POA: Insufficient documentation

## 2021-09-27 LAB — BASIC METABOLIC PANEL
Anion gap: 9 (ref 5–15)
BUN: 10 mg/dL (ref 6–20)
CO2: 22 mmol/L (ref 22–32)
Calcium: 8.8 mg/dL — ABNORMAL LOW (ref 8.9–10.3)
Chloride: 104 mmol/L (ref 98–111)
Creatinine, Ser: 0.97 mg/dL (ref 0.44–1.00)
GFR, Estimated: >60 mL/min (ref >60)
Glucose, Bld: 85 mg/dL (ref 70–99)
Potassium: 4 mmol/L (ref 3.5–5.1)
Sodium: 135 mmol/L (ref 135–145)

## 2021-09-27 LAB — CBC
HCT: 36.2 % (ref 36.0–46.0)
Hemoglobin: 12.3 g/dL (ref 12.0–15.0)
MCH: 29.7 pg (ref 26.0–34.0)
MCHC: 34 g/dL (ref 30.0–36.0)
MCV: 87.4 fL (ref 80.0–100.0)
Platelets: 278 10*3/uL (ref 150–400)
RBC: 4.14 MIL/uL (ref 3.87–5.11)
RDW: 15.6 % — ABNORMAL HIGH (ref 11.5–15.5)
WBC: 3.9 10*3/uL — ABNORMAL LOW (ref 4.0–10.5)
nRBC: 0 % (ref 0.0–0.2)

## 2021-09-27 LAB — PREGNANCY, URINE: Preg Test, Ur: NEGATIVE

## 2021-09-27 LAB — URINALYSIS, ROUTINE W REFLEX MICROSCOPIC
Bilirubin Urine: NEGATIVE
Glucose, UA: NEGATIVE mg/dL
Hgb urine dipstick: NEGATIVE
Ketones, ur: NEGATIVE mg/dL
Leukocytes,Ua: NEGATIVE
Nitrite: NEGATIVE
Protein, ur: NEGATIVE mg/dL
Specific Gravity, Urine: 1.025 (ref 1.005–1.030)
pH: 6 (ref 5.0–8.0)

## 2021-09-27 LAB — RESP PANEL BY RT-PCR (FLU A&B, COVID) ARPGX2
Influenza A by PCR: NEGATIVE
Influenza B by PCR: NEGATIVE
SARS Coronavirus 2 by RT PCR: NEGATIVE

## 2021-09-27 MED ORDER — METHOCARBAMOL 500 MG PO TABS: 500.0000 mg | ORAL_TABLET | Freq: Three times a day (TID) | ORAL | 0 refills | Status: DC | PRN

## 2021-09-27 MED ORDER — ONDANSETRON 4 MG PO TBDP: 4.0000 mg | ORAL_TABLET | Freq: Three times a day (TID) | ORAL | 0 refills | Status: DC | PRN

## 2021-09-27 MED ORDER — SODIUM CHLORIDE 0.9 % IV BOLUS
1000.0000 mL | Freq: Once | INTRAVENOUS | Status: AC
  Administered 2021-09-27: 18:00:00 1000 mL via INTRAVENOUS

## 2021-09-27 MED ORDER — OXYCODONE-ACETAMINOPHEN 5-325 MG PO TABS
1.0000 | ORAL_TABLET | Freq: Once | ORAL | Status: AC
  Administered 2021-09-27: 20:00:00 1 via ORAL
  Filled 2021-09-27: qty 1

## 2021-09-27 MED ORDER — ONDANSETRON HCL 4 MG/2ML IJ SOLN
4.0000 mg | Freq: Once | INTRAMUSCULAR | Status: AC
  Administered 2021-09-27: 18:00:00 4 mg via INTRAVENOUS
  Filled 2021-09-27: qty 2

## 2021-09-27 NOTE — ED Provider Notes (Signed)
[facility] EMERGENCY DEPARTMENT Provider Note   CSN: [id number] Arrival date & time: 09/27/21  1120     History No chief complaint on file.   Tanya Stone is a 45 y.o. female.  HPI Patient presents with right-sided flank pain.  Has had for around 3 weeks now.  Will get more intense at times but states is been pretty constant.  States worsens when she feels that she has to urinate.  No blood in the urine.  No fevers.  States over the last 3 days has had some nausea and has had diarrhea.  Got seen by urgent care couple weeks ago.  States they mention maybe it was a stone.  States that her GFR was off.  Patient does not really know more than this.  No fevers or chills.  No history of kidney stones.    History reviewed. No pertinent past medical history.  There are no problems to display for this patient.   Past Surgical History:  Procedure Laterality Date   CARPAL TUNNEL RELEASE     FOOT SURGERY     TUBAL LIGATION       OB History     Gravida  3   Para  3   Term      Preterm      AB      Living  3      SAB      IAB      Ectopic      Multiple      Live Births  3           Family History  Problem Relation Age of Onset   Hypertension Mother    Hypertension Father    Diabetes Father     Social History   Tobacco Use   Smoking status: Former    Types: Cigars   Smokeless tobacco: Never  Vaping Use   Vaping Use: Never used  Substance Use Topics   Alcohol use: Yes    Comment: Once a month    Drug use: No    Home Medications Prior to Admission medications   Medication Sig Start Date End Date Taking? Authorizing Provider  methocarbamol (ROBAXIN) 500 MG tablet Take 1 tablet (500 mg total) by mouth every 8 (eight) hours as needed for muscle spasms. 09/27/21  Yes Benjiman Core, MD  ondansetron (ZOFRAN-ODT) 4 MG disintegrating tablet Take 1 tablet (4 mg total) by mouth every 8 (eight) hours as needed for nausea or vomiting. 09/27/21  Yes  Benjiman Core, MD  acetaminophen (TYLENOL) 500 MG tablet Take 1,000 mg by mouth every 6 (six) hours as needed for moderate pain.    [provider]  Robyne Askew Paint 1.5 % LIQD Apply 1 application topically daily. 12/17/18   Vivi Barrack, DPM  cephALEXin (KEFLEX) 500 MG capsule Take 1 capsule (500 mg total) by mouth 3 (three) times daily. 03/27/19   Vivi Barrack, DPM  diclofenac sodium (VOLTAREN) 1 % GEL Apply 2 g topically 4 (four) times daily. Rub into affected area of foot 2 to 4 times daily 01/15/19   Vivi Barrack, DPM  doxycycline (VIBRA-TABS) 100 MG tablet Take 1 tablet (100 mg total) by mouth 2 (two) times daily. 05/07/19   Vivi Barrack, DPM  HYDROcodone-acetaminophen (NORCO/VICODIN) 5-325 MG tablet Take 1-2 tablets by mouth every 6 (six) hours as needed. 03/27/19   Vivi Barrack, DPM  ibuprofen (ADVIL) 800 MG tablet Take 1 tablet (800 mg total)  by mouth every 8 (eight) hours as needed. 04/01/19   Vivi Barrack, DPM  ibuprofen (ADVIL) 800 MG tablet Take 1 tablet (800 mg total) by mouth every 8 (eight) hours as needed. 05/07/19   Vivi Barrack, DPM  promethazine (PHENERGAN) 25 MG tablet Take 1 tablet (25 mg total) by mouth every 8 (eight) hours as needed for nausea or vomiting. 03/27/19   Vivi Barrack, DPM  terbinafine (LAMISIL) 250 MG tablet Take 1 tablet (250 mg total) by mouth daily. 02/11/19   Vivi Barrack, DPM    Allergies    Patient has no known allergies.  Review of Systems   Review of Systems  Constitutional:  Positive for appetite change. Negative for fatigue and fever.  HENT:  Negative for congestion.   Respiratory:  Negative for shortness of breath.   Cardiovascular:  Negative for chest pain.  Gastrointestinal:  Positive for diarrhea and nausea. Negative for abdominal pain.  Genitourinary:  Positive for flank pain.  Musculoskeletal:  Negative for back pain.  Neurological:  Negative for seizures.  Psychiatric/Behavioral:   Negative for confusion.    Physical Exam Updated Vital Signs BP (!) 145/83 (BP Location: Right Arm)    Pulse 85    Temp 99.3 F (37.4 C) (Oral)    Resp 18    Ht 5\' 10"  (1.778 m)    Wt 108.9 kg    LMP 09/15/2021    SpO2 100%    BMI 34.44 kg/m   Physical Exam Vitals and nursing note reviewed.  HENT:     Head: Atraumatic.  Eyes:     Pupils: Pupils are equal, round, and reactive to light.  Cardiovascular:     Rate and Rhythm: Normal rate.  Pulmonary:     Breath sounds: No wheezing.  Abdominal:     Tenderness: There is no abdominal tenderness.  Genitourinary:    Comments: CVA tenderness on right Musculoskeletal:        General: Normal range of motion.     Cervical back: Neck supple.  Skin:    General: Skin is warm.     Capillary Refill: Capillary refill takes less than 2 seconds.  Neurological:     Mental Status: She is alert and oriented to person, place, and time.    ED Results / Procedures / Treatments   Labs (all labs ordered are listed, but only abnormal results are displayed) Labs Reviewed  BASIC METABOLIC PANEL - Abnormal; Notable for the following components:      Result Value   Calcium 8.8 (*)    All other components within normal limits  CBC - Abnormal; Notable for the following components:   WBC 3.9 (*)    RDW 15.6 (*)    All other components within normal limits  RESP PANEL BY RT-PCR (FLU A&B, COVID) ARPGX2  URINALYSIS, ROUTINE W REFLEX MICROSCOPIC  PREGNANCY, URINE    EKG None  Radiology CT Renal Stone Study  Result Date: 09/27/2021 CLINICAL DATA:  Worsening right-sided abdominal pain that was intermittent now constant with nausea. EXAM: CT ABDOMEN AND PELVIS WITHOUT CONTRAST TECHNIQUE: Multidetector CT imaging of the abdomen and pelvis was performed following the standard protocol without IV contrast. COMPARISON:  May 10, 2018 FINDINGS: Lower chest: No acute abnormality. Hepatobiliary: Unremarkable noncontrast appearance of the hepatic parenchyma.  Gallbladder is unremarkable. No biliary ductal dilation. Pancreas: No pancreatic ductal dilation or evidence of acute inflammation. Spleen: Within normal limits. Adrenals/Urinary Tract: Bilateral adrenal glands are unremarkable. No hydronephrosis. No nephrolithiasis.  No obstructive ureteral or bladder calculi identified. Right-sided pelvic phleboliths appear stable from May 10, 2018. Urinary bladder is unremarkable for degree of distension. Stomach/Bowel: No enteric contrast was administered. Stomach is unremarkable for degree of distension. No pathologic dilation of small or large bowel. The appendix appears normal. Gas fluid levels in the colon suggestive of diarrheal illness. Vascular/Lymphatic: No abdominal aortic aneurysm. No pathologically enlarged abdominal or pelvic lymph nodes. Reproductive: Uterus and bilateral adnexa are unremarkable. Other: No significant abdominopelvic free fluid. No pneumoperitoneum. Musculoskeletal: Mild thoracolumbar spondylosis. No acute osseous abnormality. IMPRESSION: 1. Gas fluid levels in the colon suggestive of diarrheal illness. 2. Otherwise, no acute findings in the abdomen or pelvis. Normal appendix and no evidence of obstructive uropathy. Electronically Signed   By: Maudry Mayhew M.D.   On: 09/27/2021 16:44    Procedures Procedures   Medications Ordered in ED Medications  sodium chloride 0.9 % bolus 1,000 mL (1,000 mLs Intravenous New Bag/Given 09/27/21 1754)  ondansetron (ZOFRAN) injection 4 mg (4 mg Intravenous Given 09/27/21 1753)    ED Course  I have reviewed the triage vital signs and the nursing notes.  Pertinent labs & imaging results that were available during my care of the patient were reviewed by me and considered in my medical decision making (see chart for details).    MDM Rules/Calculators/A&P                          Patient with right flank pain.  Is had for around 3 weeks.  Worse with certain movements.  Also have some spasming times.   Around 3 days had nausea and vomiting.  States she had been seen at urgent care and her "GFR" was off.  Urine reassuring today.  Kidney function good.  CT scan due to flank pain and did not show stone does have area.  Appears stable for discharge.  Will treat symptomatically.  Outpatient follow-up with PCP as needed    Final Clinical Impression(s) / ED Diagnoses Final diagnoses:  Flank pain  Diarrhea, unspecified type    Rx / DC Orders ED Discharge Orders          Ordered    methocarbamol (ROBAXIN) 500 MG tablet  Every 8 hours PRN        09/27/21 1836    ondansetron (ZOFRAN-ODT) 4 MG disintegrating tablet  Every 8 hours PRN        09/27/21 1836             Benjiman Core, MD 09/27/21 1840

## 2021-09-27 NOTE — Discharge Instructions (Signed)
Follow-up with your doctor as needed.  Try and keep her self hydrated.  Take the muscle relaxer to see if it will help with the back pain.

## 2021-09-27 NOTE — ED Triage Notes (Signed)
Pt to ED c/o flank pain for 3 weeks. Also c/o nausea and diarrhea over past 3 days, scene by  urgent care for same problem.

## 2021-09-30 ENCOUNTER — Ambulatory Visit: Payer: Medicaid Other | Admitting: Podiatry

## 2021-10-19 ENCOUNTER — Ambulatory Visit: Payer: Medicaid Other | Admitting: Obstetrics

## 2021-10-21 ENCOUNTER — Ambulatory Visit: Payer: Self-pay | Admitting: Podiatry

## 2022-02-04 ENCOUNTER — Other Ambulatory Visit: Payer: Self-pay | Admitting: Obstetrics

## 2022-02-04 DIAGNOSIS — Z1231 Encounter for screening mammogram for malignant neoplasm of breast: Secondary | ICD-10-CM

## 2022-03-16 ENCOUNTER — Ambulatory Visit: Payer: Medicaid Other

## 2022-04-04 ENCOUNTER — Inpatient Hospital Stay: Admission: RE | Admit: 2022-04-04 | Payer: Medicaid Other | Source: Ambulatory Visit

## 2022-04-07 ENCOUNTER — Inpatient Hospital Stay: Admission: RE | Admit: 2022-04-07 | Payer: Medicaid Other | Source: Ambulatory Visit

## 2022-04-08 ENCOUNTER — Ambulatory Visit
Admission: RE | Admit: 2022-04-08 | Discharge: 2022-04-08 | Disposition: A | Discharge status: Home / Self Care | Payer: Medicaid Other | Source: Ambulatory Visit | Attending: Obstetrics | Admitting: Obstetrics

## 2022-04-08 DIAGNOSIS — Z1231 Encounter for screening mammogram for malignant neoplasm of breast: Secondary | ICD-10-CM

## 2023-10-10 ENCOUNTER — Ambulatory Visit: Payer: BLUE CROSS/BLUE SHIELD | Admitting: Obstetrics

## 2023-10-10 ENCOUNTER — Encounter: Payer: Self-pay | Admitting: Obstetrics

## 2023-10-10 ENCOUNTER — Other Ambulatory Visit (HOSPITAL_COMMUNITY)
Admission: RE | Admit: 2023-10-10 | Discharge: 2023-10-10 | Disposition: A | Discharge status: Home / Self Care | Payer: BLUE CROSS/BLUE SHIELD | Source: Ambulatory Visit | Attending: Obstetrics | Admitting: Obstetrics

## 2023-10-10 VITALS — BP 151/83 | HR 83 | Wt 267.7 lb

## 2023-10-10 DIAGNOSIS — N946 Dysmenorrhea, unspecified: Secondary | ICD-10-CM

## 2023-10-10 DIAGNOSIS — Z124 Encounter for screening for malignant neoplasm of cervix: Secondary | ICD-10-CM | POA: Diagnosis present

## 2023-10-10 DIAGNOSIS — N898 Other specified noninflammatory disorders of vagina: Secondary | ICD-10-CM | POA: Diagnosis present

## 2023-10-10 DIAGNOSIS — E669 Obesity, unspecified: Secondary | ICD-10-CM

## 2023-10-10 DIAGNOSIS — Z0000 Encounter for general adult medical examination without abnormal findings: Secondary | ICD-10-CM | POA: Diagnosis present

## 2023-10-10 DIAGNOSIS — Z01419 Encounter for gynecological examination (general) (routine) without abnormal findings: Secondary | ICD-10-CM

## 2023-10-10 DIAGNOSIS — R300 Dysuria: Secondary | ICD-10-CM

## 2023-10-10 DIAGNOSIS — Z1239 Encounter for other screening for malignant neoplasm of breast: Secondary | ICD-10-CM | POA: Diagnosis not present

## 2023-10-10 LAB — POCT URINALYSIS DIPSTICK
Appearance: NORMAL
Bilirubin, UA: NEGATIVE
Blood, UA: NEGATIVE
Glucose, UA: NEGATIVE
Ketones, UA: NEGATIVE
Leukocytes, UA: NEGATIVE
Nitrite, UA: NEGATIVE
Odor: NORMAL
Protein, UA: NEGATIVE
Spec Grav, UA: 1.02 (ref 1.010–1.025)
Urobilinogen, UA: 0.2 U/dL
pH, UA: 5.5 (ref 5.0–8.0)

## 2023-10-10 MED ORDER — IBUPROFEN 800 MG PO TABS: 800.0000 mg | ORAL_TABLET | Freq: Three times a day (TID) | ORAL | 5 refills | Status: DC | PRN

## 2023-10-10 NOTE — Progress Notes (Signed)
 Subjective:        Tanya Stone is a 48 y.o. female here for a routine exam.  Current complaints: Right flank pain when voiding.  She had a negative work-up 2 weeks ago, including a negative urine culture and negative CT of abdomen and pelvis..    Personal health questionnaire:  Is patient [name], have a family history of breast and/or ovarian cancer: no Is there a family history of uterine cancer diagnosed at age < 81, gastrointestinal cancer, urinary tract cancer, family member who is a [name]: no Is the patient overweight and hypertensive, family history of diabetes, personal history of gestational diabetes, preeclampsia or PCOS: no Is patient over 43, have PCOS,  family history of premature CHD under age 76, diabetes, smoke, have hypertension or peripheral artery disease:  no At any time, has a partner hit, kicked or otherwise hurt or frightened you?: no Over the past 2 weeks, have you felt down, depressed or hopeless?: no Over the past 2 weeks, have you felt little interest or pleasure in doing things?:no   Gynecologic History Patient's last menstrual period was 09/08/2023 (approximate). Contraception: tubal ligation Last Pap: 2019. Results were: HGSIL Last mammogram: 2023. Results were: normal  Obstetric History OB History  Gravida Para Term Preterm AB Living  3 3    3   SAB IAB Ectopic Multiple Live Births      3    # Outcome Date GA Lbr Len/2nd Weight Sex Type Anes PTL Lv  3 Para 06/18/05     Vag-Spont   LIV  2 Para 01/19/99     Vag-Spont   LIV  1 Para 11/10/94     Vag-Spont   LIV    History reviewed. No pertinent past medical history.  Past Surgical History:  Procedure Laterality Date   CARPAL TUNNEL RELEASE     FOOT SURGERY     TUBAL LIGATION       Current Outpatient Medications:    acetaminophen  (TYLENOL ) 500 MG tablet, Take 1,000 mg by mouth every 6 (six) hours as needed for moderate pain., Disp: , Rfl:     Castellani Paint 1.5 % LIQD, Apply 1 application topically daily. (Patient not taking: Reported on 09/27/2021), Disp: 1 Bottle, Rfl: 1   cephALEXin  (KEFLEX ) 500 MG capsule, Take 1 capsule (500 mg total) by mouth 3 (three) times daily. (Patient not taking: Reported on 09/27/2021), Disp: 21 capsule, Rfl: 0   diclofenac  sodium (VOLTAREN ) 1 % GEL, Apply 2 g topically 4 (four) times daily. Rub into affected area of foot 2 to 4 times daily (Patient not taking: Reported on 10/10/2023), Disp: 100 g, Rfl: 2   doxycycline  (VIBRA -TABS) 100 MG tablet, Take 1 tablet (100 mg total) by mouth 2 (two) times daily. (Patient not taking: Reported on 09/27/2021), Disp: 20 tablet, Rfl: 0   HYDROcodone -acetaminophen  (NORCO/VICODIN) 5-325 MG tablet, Take 1-2 tablets by mouth every 6 (six) hours as needed. (Patient not taking: Reported on 09/27/2021), Disp: 25 tablet, Rfl: 0   ibuprofen  (ADVIL ) 800 MG tablet, Take 1 tablet (800 mg total) by mouth every 8 (eight) hours as needed. (Patient not taking: Reported on 09/27/2021), Disp: 30 tablet, Rfl: 0   ibuprofen  (ADVIL ) 800 MG tablet, Take 1 tablet (800 mg total) by mouth every 8 (eight) hours as needed., Disp: 30 tablet, Rfl: 5   methocarbamol  (ROBAXIN ) 500 MG tablet, Take 1 tablet (500 mg total) by mouth every 8 (eight) hours as needed for muscle spasms. (Patient not  taking: Reported on 10/10/2023), Disp: 8 tablet, Rfl: 0   ondansetron  (ZOFRAN -ODT) 4 MG disintegrating tablet, Take 1 tablet (4 mg total) by mouth every 8 (eight) hours as needed for nausea or vomiting. (Patient not taking: Reported on 10/10/2023), Disp: 8 tablet, Rfl: 0   promethazine  (PHENERGAN ) 25 MG tablet, Take 1 tablet (25 mg total) by mouth every 8 (eight) hours as needed for nausea or vomiting. (Patient not taking: Reported on 09/27/2021), Disp: 20 tablet, Rfl: 0   terbinafine  (LAMISIL ) 250 MG tablet, Take 1 tablet (250 mg total) by mouth daily. (Patient not taking: Reported on 09/27/2021), Disp: 14 tablet, Rfl:  0 No Known Allergies  Social History   Tobacco Use   Smoking status: Former    Types: Cigars   Smokeless tobacco: Never  Substance Use Topics   Alcohol use: Yes    Comment: Once a month     Family History  Problem Relation Age of Onset   Hypertension Mother    Hypertension Father    Diabetes Father       Review of Systems  Constitutional: negative for fatigue and weight loss Respiratory: negative for cough and wheezing Cardiovascular: negative for chest pain, fatigue and palpitations Gastrointestinal: negative for abdominal pain and change in bowel habits Musculoskeletal:negative for myalgias Neurological: negative for gait problems and tremors Behavioral/Psych: negative for abusive relationship, depression Endocrine: negative for temperature intolerance    Genitourinary positive for right flank pain and dysuria.  :negative for abnormal menstrual periods, genital lesions, hot flashes, sexual problems and vaginal discharge Integument/breast: negative for breast lump, breast tenderness, nipple discharge and skin lesion(s)    Objective:       BP (!) 151/83   Pulse 83   Wt 267 lb 11.2 oz (121.4 kg)   LMP 09/08/2023 (Approximate)   BMI 38.41 kg/m  General:   Alert and no distress  Skin:   no rash or abnormalities  Lungs:   clear to auscultation bilaterally  Heart:   regular rate and rhythm, S1, S2 normal, no murmur, click, rub or gallop  Breasts:   normal without suspicious masses, skin or nipple changes or axillary nodes  Abdomen:  Positive right flank tenderness to percussion  Pelvis:  External genitalia: normal general appearance Urinary system: urethral meatus normal and bladder without fullness, nontender Vaginal: normal without tenderness, induration or masses Cervix: normal appearance Adnexa: normal bimanual exam Uterus: anteverted and non-tender, normal size   Lab Review Urine pregnancy test Labs reviewed yes Radiologic studies reviewed yes   CT Renal  Bethena Rear (Accession [id number]) (Order [id number]) Imaging Date: 09/27/2021 Department: [facility] Released By/Authorizing: Patsey Lot, MD (auto-released)   Exam Status  Status  Final [99]   PACS Intelerad Image Link   Show images for CT Renal Stone Study Study Result  Narrative & Impression  CLINICAL DATA:  Worsening right-sided abdominal pain that was intermittent now constant with nausea.   EXAM: CT ABDOMEN AND PELVIS WITHOUT CONTRAST   TECHNIQUE: Multidetector CT imaging of the abdomen and pelvis was performed following the standard protocol without IV contrast.   COMPARISON:  May 10, 2018   FINDINGS: Lower chest: No acute abnormality.   Hepatobiliary: Unremarkable noncontrast appearance of the hepatic parenchyma. Gallbladder is unremarkable. No biliary ductal dilation.   Pancreas: No pancreatic ductal dilation or evidence of acute inflammation.   Spleen: Within normal limits.   Adrenals/Urinary Tract: Bilateral adrenal glands are unremarkable. No hydronephrosis. No nephrolithiasis. No obstructive ureteral or  bladder calculi identified. Right-sided pelvic phleboliths appear stable from May 10, 2018. Urinary bladder is unremarkable for degree of distension.   Stomach/Bowel: No enteric contrast was administered. Stomach is unremarkable for degree of distension. No pathologic dilation of small or large bowel. The appendix appears normal. Gas fluid levels in the colon suggestive of diarrheal illness.   Vascular/Lymphatic: No abdominal aortic aneurysm. No pathologically enlarged abdominal or pelvic lymph nodes.   Reproductive: Uterus and bilateral adnexa are unremarkable.   Other: No significant abdominopelvic free fluid. No pneumoperitoneum.   Musculoskeletal: Mild thoracolumbar spondylosis. No acute osseous abnormality.   IMPRESSION: 1. Gas fluid levels in the colon suggestive of diarrheal  illness. 2. Otherwise, no acute findings in the abdomen or pelvis. Normal appendix and no evidence of obstructive uropathy.     Electronically Signed   By: Reyes Holder M.D.   On: 09/27/2021 16:44         I have spent a total of 20 minutes of face-to-face time, excluding clinical staff time, reviewing notes and preparing to see patient, ordering tests and/or medications, and counseling the patient.   Assessment:    1. Encounter for gynecological examination with Papanicolaou smear of cervix (Primary) Rx: - Cytology - PAP( [place])  2. Dysuria Rx - Urine Culture - POCT Urinalysis Dipstick  3. Dysmenorrhea Rx: - ibuprofen  (ADVIL ) 800 MG tablet; Take 1 tablet (800 mg total) by mouth every 8 (eight) hours as needed.  Dispense: 30 tablet; Refill: 5  4. Screening breast examination Rx: - MM 3D SCREENING MAMMOGRAM BILATERAL BREAST; Future  5. Obesity (BMI 35.0-39.9 without comorbidity) - weight reduction with the aid of dietary changes, exercise and behavioral modification recommended  6. Vaginal discharge Rx: - Cervicovaginal ancillary only( [place])    Plan:    Education reviewed: calcium supplements, depression evaluation, low fat, low cholesterol diet, safe sex/STD prevention, self breast exams, and weight bearing exercise. Mammogram ordered. Follow up in: 1 year.   Meds ordered this encounter  Medications   ibuprofen  (ADVIL ) 800 MG tablet    Sig: Take 1 tablet (800 mg total) by mouth every 8 (eight) hours as needed.    Dispense:  30 tablet    Refill:  5   Orders Placed This Encounter  Procedures   Urine Culture   MM 3D SCREENING MAMMOGRAM BILATERAL BREAST    PWD:ARAD NOT WAKE  ANNUAL// NO PROBLEMS// NO HX OF BREAST CANCER  PREV: 04/2022 @[facility]   NO NEEDS AJ SW KIM @DR  OFC  PT AWARE $75 NO SHOW/CANCELLATION FEE WITHIN 24 HOURS    Standing Status:   Future    Expiration Date:   10/09/2024    Reason for Exam (SYMPTOM  OR DIAGNOSIS REQUIRED):   Annual     Is the patient pregnant?:   No    Preferred imaging location?:   [facility]   POCT Urinalysis Dipstick     CARLIN RONAL CENTERS, MD, FACOG Attending Obstetrician & Gynecologist, [facility] for [facility], [facility], [place] 10/10/2023

## 2023-10-10 NOTE — Progress Notes (Signed)
 Pt presents for AEX/PAP. Requests STD testing. Reports pain in lower back as well as some abdominal cramping. Has relief after urination.

## 2023-10-11 LAB — CERVICOVAGINAL ANCILLARY ONLY
Bacterial Vaginitis (gardnerella): NEGATIVE
Candida Glabrata: NEGATIVE
Candida Vaginitis: NEGATIVE
Chlamydia: NEGATIVE
Comment: NEGATIVE
Comment: NEGATIVE
Comment: NEGATIVE
Comment: NEGATIVE
Comment: NEGATIVE
Comment: NORMAL
Neisseria Gonorrhea: NEGATIVE
Trichomonas: NEGATIVE

## 2023-10-12 LAB — URINE CULTURE: Organism ID, Bacteria: NO GROWTH

## 2023-10-13 LAB — CYTOLOGY - PAP
Comment: NEGATIVE
Diagnosis: NEGATIVE
High risk HPV: NEGATIVE

## 2023-10-31 ENCOUNTER — Ambulatory Visit: Payer: BLUE CROSS/BLUE SHIELD

## 2023-11-14 ENCOUNTER — Ambulatory Visit: Payer: BLUE CROSS/BLUE SHIELD

## 2023-11-17 ENCOUNTER — Ambulatory Visit: Payer: BLUE CROSS/BLUE SHIELD

## 2023-11-24 ENCOUNTER — Ambulatory Visit
Admission: RE | Admit: 2023-11-24 | Discharge: 2023-11-24 | Disposition: A | Discharge status: Home / Self Care | Payer: BLUE CROSS/BLUE SHIELD | Source: Ambulatory Visit | Attending: Obstetrics | Admitting: Obstetrics

## 2023-11-24 DIAGNOSIS — Z1239 Encounter for other screening for malignant neoplasm of breast: Secondary | ICD-10-CM

## 2023-11-28 ENCOUNTER — Encounter: Payer: Self-pay | Admitting: Family Medicine

## 2024-03-01 ENCOUNTER — Ambulatory Visit: Admitting: Podiatry

## 2024-03-18 ENCOUNTER — Ambulatory Visit (INDEPENDENT_AMBULATORY_CARE_PROVIDER_SITE_OTHER): Admitting: Podiatry

## 2024-03-18 ENCOUNTER — Ambulatory Visit (INDEPENDENT_AMBULATORY_CARE_PROVIDER_SITE_OTHER)

## 2024-03-18 DIAGNOSIS — M7752 Other enthesopathy of left foot: Secondary | ICD-10-CM | POA: Diagnosis not present

## 2024-03-18 DIAGNOSIS — R52 Pain, unspecified: Secondary | ICD-10-CM

## 2024-03-18 MED ORDER — TRIAMCINOLONE ACETONIDE 10 MG/ML IJ SUSP: 5.0000 mg | Freq: Once | INTRAMUSCULAR | Status: AC

## 2024-03-18 NOTE — Patient Instructions (Addendum)
 For inserts I like POWERSTEPS, SUPERFEET   While at your visit today you received a steroid injection in your foot or ankle to help with your pain. Along with having the steroid medication there is some numbing medication in the shot that you received. Due to this you may notice some numbness to the area for the next couple of hours.   I would recommend limiting activity for the next few days to help the steroid injection take affect.    The actually benefit from the steroid injection may take up to 2-7 days to see a difference. You may actually experience a small (as in 10%) INCREASE in pain in the first 24 hours---that is common. It would be best if you can ice the area today and take anti-inflammatory medications (such as Ibuprofen , Motrin , or Aleve ) if you are able to take these medications. If you were prescribed another medication to help with the pain go ahead and start that medication today    Things to watch out for that you should contact us  or a health care provider urgently would include: 1. Unusual (as in more than 10%) increase in pain 2. New fever > 101.5 3. New swelling or redness of the injected area.  4. Streaking of red lines around the area injected.  If you have any questions or concerns about this, please give our office a call at [phone].

## 2024-03-20 NOTE — Progress Notes (Signed)
  Subjective:  Patient ID: Tanya Stone, female    DOB: 02/18/1976,  MRN: [id number]  Chief Complaint  Patient presents with   Heel Spurs    Left foot, possibly a bone spur. Has noticed this for the past couple of months. Also reports a burning, sharp sensation that is intermittent in the left 2nd ongoing for about 3 weeks.     Discussed the use of AI scribe software for clinical note transcription with the patient, who gave verbal consent to proceed.  History of Present Illness Tanya Stone is a 48 year old female who presents with left foot pain which is new. She was referred by the hospital in [place], [place]  after an x-ray evaluation.  She has experienced pain in the left foot for the past couple of months, localized to the ball of the foot and occasionally extending to the second toe with a sharp, burning sensation. The pain is intermittent. X-rays were performed at a hospital in [place], [place] . She currently takes 800 mg of ibuprofen  as needed for pain relief. There is no swelling, numbness, tingling, or pain radiating up the leg or into other toes.      Objective:    Physical Exam General: AAO x3, NAD  Dermatological: Skin is warm, dry and supple bilateral.  There are no open sores, no preulcerative lesions, no rash or signs of infection present.  Vascular: Dorsalis Pedis artery and Posterior Tibial artery pedal pulses are 2/4 bilateral with immedate capillary fill time.  There is no pain with calf compression, swelling, warmth, erythema.   Neruologic: Grossly intact via light touch bilateral.  Negative Tinel sign.  Musculoskeletal: There is tenderness palpation of the second MTPJ.  Localized edema there is no erythema or warmth.  No pain in the toe itself.  I am not able to palpate a neuroma.  Flexor, extensor tendons appear to be intact.  MMT 5/5.  Gait: Unassisted, Nonantalgic.     No images are attached to the encounter.     Results   RADIOLOGY Foot X-ray: Bone structure normal, no evidence of acute fracture.  Pes planovalgus noted.   Assessment:   1. Capsulitis of metatarsophalangeal (MTP) joint of left foot      Plan:  Patient was evaluated and treated and all questions answered.  Assessment and Plan Assessment & Plan Capsulitis of the second toe - Administer steroid injection to reduce inflammation.  Skin was noted Betadine, alcohol.  Mixture 1 cc Kenalog  10, 0.5 cc Marcaine  plain, 0.5 cc of lidocaine  plain was infiltrated into around second MTPJ without any complications.  Tolerated well.  Postprocedure instructions discussed. - Continue ibuprofen  800 mg for pain management. - Apply metatarsal pads to redistribute pressure. - Instruct to ice the affected area regularly. - Consider custom insoles if symptoms persist. - Discuss custom orthotics if over-the-counter insoles are insufficient.  Flat foot (pes planus) Flat foot increases pressure on the balls of the feet, exacerbating capsulitis. X-rays confirm mechanical pressure issues. - Recommend insoles with arch support. - Advise against wearing flat shoes and walking barefoot. - Provide information on over-the-counter insoles and brands, such as power steps, for additional support.   Return if symptoms worsen or fail to improve.   Charity Conch DPM

## 2024-09-06 ENCOUNTER — Ambulatory Visit: Admitting: Podiatry

## 2024-10-14 ENCOUNTER — Other Ambulatory Visit: Payer: Self-pay | Admitting: Obstetrics

## 2024-10-14 DIAGNOSIS — N946 Dysmenorrhea, unspecified: Secondary | ICD-10-CM
